# Patient Record
Sex: Female | Born: 1935 | Race: White | Hispanic: No | Marital: Married
Health system: Southern US, Community
[De-identification: ages and names within clinical notes are randomized; demographics above are authoritative.]

---

## 2004-08-30 ENCOUNTER — Ambulatory Visit: Payer: Self-pay | Admitting: Gastroenterology

## 2004-11-06 ENCOUNTER — Ambulatory Visit: Payer: Self-pay

## 2005-11-22 ENCOUNTER — Ambulatory Visit: Payer: Self-pay

## 2006-12-04 ENCOUNTER — Ambulatory Visit: Payer: Self-pay

## 2007-12-15 ENCOUNTER — Ambulatory Visit: Payer: Self-pay | Admitting: Unknown Physician Specialty

## 2007-12-17 ENCOUNTER — Ambulatory Visit: Payer: Self-pay

## 2008-07-21 ENCOUNTER — Ambulatory Visit: Payer: Self-pay | Admitting: Internal Medicine

## 2008-07-25 ENCOUNTER — Ambulatory Visit: Payer: Self-pay | Admitting: Internal Medicine

## 2008-10-27 ENCOUNTER — Inpatient Hospital Stay: Payer: Self-pay | Admitting: Internal Medicine

## 2008-11-07 ENCOUNTER — Emergency Department: Payer: Self-pay | Admitting: Emergency Medicine

## 2008-11-17 ENCOUNTER — Ambulatory Visit: Payer: Self-pay | Admitting: Neurology

## 2009-01-09 ENCOUNTER — Ambulatory Visit: Payer: Self-pay

## 2009-06-09 ENCOUNTER — Emergency Department: Payer: Self-pay | Admitting: Emergency Medicine

## 2011-12-01 ENCOUNTER — Emergency Department: Payer: Self-pay | Admitting: Internal Medicine

## 2011-12-01 LAB — CBC
HCT: 48.6 % — ABNORMAL HIGH (ref 35.0–47.0)
HGB: 15.4 g/dL (ref 12.0–16.0)
MCHC: 31.8 g/dL — ABNORMAL LOW (ref 32.0–36.0)
Platelet: 353 10*3/uL (ref 150–440)
RBC: 5.24 10*6/uL — ABNORMAL HIGH (ref 3.80–5.20)
WBC: 15.6 10*3/uL — ABNORMAL HIGH (ref 3.6–11.0)

## 2011-12-01 LAB — COMPREHENSIVE METABOLIC PANEL
Albumin: 2.8 g/dL — ABNORMAL LOW (ref 3.4–5.0)
Anion Gap: 7 (ref 7–16)
BUN: 21 mg/dL — ABNORMAL HIGH (ref 7–18)
Calcium, Total: 10.7 mg/dL — ABNORMAL HIGH (ref 8.5–10.1)
Creatinine: 1.35 mg/dL — ABNORMAL HIGH (ref 0.60–1.30)
Glucose: 353 mg/dL — ABNORMAL HIGH (ref 65–99)
Osmolality: 276 (ref 275–301)
Potassium: 3.9 mmol/L (ref 3.5–5.1)
Sodium: 129 mmol/L — ABNORMAL LOW (ref 136–145)
Total Protein: 9.1 g/dL — ABNORMAL HIGH (ref 6.4–8.2)

## 2011-12-01 LAB — PROTIME-INR
INR: 0.9
Prothrombin Time: 12.9 secs (ref 11.5–14.7)

## 2011-12-01 LAB — TROPONIN I: Troponin-I: <0.02 ng/mL

## 2011-12-21 ENCOUNTER — Emergency Department: Payer: Self-pay | Admitting: Emergency Medicine

## 2011-12-21 LAB — URINALYSIS, COMPLETE
Blood: NEGATIVE
Glucose,UR: 150 mg/dL (ref 0–75)
Ketone: NEGATIVE
Nitrite: POSITIVE
RBC,UR: NONE SEEN /HPF (ref 0–5)
Specific Gravity: 1.012 (ref 1.003–1.030)
Squamous Epithelial: <1
WBC UR: 1 /HPF (ref 0–5)

## 2011-12-21 LAB — CBC
HCT: 49.8 % — ABNORMAL HIGH (ref 35.0–47.0)
HGB: 16.9 g/dL — ABNORMAL HIGH (ref 12.0–16.0)
MCV: 91 fL (ref 80–100)
Platelet: 200 10*3/uL (ref 150–440)
RBC: 5.48 10*6/uL — ABNORMAL HIGH (ref 3.80–5.20)
RDW: 12.6 % (ref 11.5–14.5)

## 2011-12-21 LAB — COMPREHENSIVE METABOLIC PANEL
Albumin: 3.2 g/dL — ABNORMAL LOW (ref 3.4–5.0)
Anion Gap: 12 (ref 7–16)
BUN: 68 mg/dL — ABNORMAL HIGH (ref 7–18)
Calcium, Total: 9.4 mg/dL (ref 8.5–10.1)
Glucose: 232 mg/dL — ABNORMAL HIGH (ref 65–99)
Potassium: 3.7 mmol/L (ref 3.5–5.1)
SGOT(AST): 54 U/L — ABNORMAL HIGH (ref 15–37)
SGPT (ALT): 33 U/L
Sodium: 136 mmol/L (ref 136–145)
Total Protein: 8.6 g/dL — ABNORMAL HIGH (ref 6.4–8.2)

## 2011-12-21 LAB — TROPONIN I: Troponin-I: <0.02 ng/mL

## 2011-12-23 LAB — URINE CULTURE

## 2011-12-24 ENCOUNTER — Emergency Department: Payer: Self-pay | Admitting: Emergency Medicine

## 2011-12-24 LAB — COMPREHENSIVE METABOLIC PANEL
Albumin: 3.2 g/dL — ABNORMAL LOW (ref 3.4–5.0)
Alkaline Phosphatase: 377 U/L — ABNORMAL HIGH (ref 50–136)
Anion Gap: 12 (ref 7–16)
BUN: 42 mg/dL — ABNORMAL HIGH (ref 7–18)
Bilirubin,Total: 0.3 mg/dL (ref 0.2–1.0)
Calcium, Total: 9.2 mg/dL (ref 8.5–10.1)
Chloride: 103 mmol/L (ref 98–107)
Co2: 22 mmol/L (ref 21–32)
Creatinine: 2.15 mg/dL — ABNORMAL HIGH (ref 0.60–1.30)
EGFR (African American): 25 — ABNORMAL LOW
EGFR (Non-African Amer.): 21 — ABNORMAL LOW
Glucose: 284 mg/dL — ABNORMAL HIGH (ref 65–99)
Osmolality: 295 (ref 275–301)
Potassium: 3.9 mmol/L (ref 3.5–5.1)
SGOT(AST): 40 U/L — ABNORMAL HIGH (ref 15–37)
SGPT (ALT): 34 U/L
Sodium: 137 mmol/L (ref 136–145)
Total Protein: 8.3 g/dL — ABNORMAL HIGH (ref 6.4–8.2)

## 2011-12-24 LAB — URINALYSIS, COMPLETE
Bilirubin,UR: NEGATIVE
Blood: NEGATIVE
Glucose,UR: 150 mg/dL (ref 0–75)
Granular Cast: 1
Hyaline Cast: 3
Leukocyte Esterase: NEGATIVE
Ph: 5 (ref 4.5–8.0)
Protein: NEGATIVE
RBC,UR: NONE SEEN /HPF (ref 0–5)

## 2011-12-24 LAB — TROPONIN I: Troponin-I: <0.02 ng/mL

## 2011-12-24 LAB — CBC WITH DIFFERENTIAL/PLATELET
Basophil #: 0.1 10*3/uL (ref 0.0–0.1)
Basophil %: 0.6 %
Eosinophil #: 0.1 10*3/uL (ref 0.0–0.7)
Eosinophil %: 1.5 %
HGB: 17.1 g/dL — ABNORMAL HIGH (ref 12.0–16.0)
Lymphocyte %: 29.1 %
Monocyte #: 0.6 x10 3/mm (ref 0.2–0.9)
Monocyte %: 6.6 %
Neutrophil #: 5.7 10*3/uL (ref 1.4–6.5)
Neutrophil %: 62.2 %
Platelet: 185 10*3/uL (ref 150–440)
RBC: 5.41 10*6/uL — ABNORMAL HIGH (ref 3.80–5.20)
WBC: 9.2 10*3/uL (ref 3.6–11.0)

## 2012-05-09 LAB — URINALYSIS, COMPLETE
Bilirubin,UR: NEGATIVE
Ketone: NEGATIVE
Nitrite: NEGATIVE
Ph: 7 (ref 4.5–8.0)
Protein: NEGATIVE
RBC,UR: 2 /HPF (ref 0–5)
Specific Gravity: 1.005 (ref 1.003–1.030)

## 2012-05-09 LAB — CBC
HGB: 15 g/dL (ref 12.0–16.0)
MCHC: 34.2 g/dL (ref 32.0–36.0)
MCV: 93 fL (ref 80–100)
RBC: 4.71 10*6/uL (ref 3.80–5.20)
WBC: 9.6 10*3/uL (ref 3.6–11.0)

## 2012-05-09 LAB — COMPREHENSIVE METABOLIC PANEL
Alkaline Phosphatase: 420 U/L — ABNORMAL HIGH (ref 50–136)
Anion Gap: 10 (ref 7–16)
BUN: 20 mg/dL — ABNORMAL HIGH (ref 7–18)
Calcium, Total: 9 mg/dL (ref 8.5–10.1)
Chloride: 107 mmol/L (ref 98–107)
Co2: 23 mmol/L (ref 21–32)
EGFR (African American): >60
EGFR (Non-African Amer.): >60
Potassium: 4 mmol/L (ref 3.5–5.1)
SGOT(AST): 65 U/L — ABNORMAL HIGH (ref 15–37)
SGPT (ALT): 69 U/L (ref 12–78)
Total Protein: 7.9 g/dL (ref 6.4–8.2)

## 2012-05-09 LAB — TROPONIN I: Troponin-I: <0.02 ng/mL

## 2012-05-10 ENCOUNTER — Inpatient Hospital Stay: Payer: Self-pay | Admitting: Internal Medicine

## 2012-05-10 LAB — AMMONIA: Ammonia, Plasma: 34 mcmol/L — ABNORMAL HIGH (ref 11–32)

## 2012-05-11 LAB — LIPID PANEL
Cholesterol: 190 mg/dL (ref 0–200)
Ldl Cholesterol, Calc: 122 mg/dL — ABNORMAL HIGH (ref 0–100)
VLDL Cholesterol, Calc: 41 mg/dL — ABNORMAL HIGH (ref 5–40)

## 2012-05-11 LAB — COMPREHENSIVE METABOLIC PANEL
Albumin: 2.8 g/dL — ABNORMAL LOW (ref 3.4–5.0)
Alkaline Phosphatase: 325 U/L — ABNORMAL HIGH (ref 50–136)
Bilirubin,Total: 0.3 mg/dL (ref 0.2–1.0)
Co2: 26 mmol/L (ref 21–32)
Creatinine: 0.75 mg/dL (ref 0.60–1.30)
EGFR (Non-African Amer.): >60
Glucose: 106 mg/dL — ABNORMAL HIGH (ref 65–99)
Osmolality: 280 (ref 275–301)
SGPT (ALT): 54 U/L (ref 12–78)
Sodium: 140 mmol/L (ref 136–145)
Total Protein: 6.8 g/dL (ref 6.4–8.2)

## 2012-05-11 LAB — CBC WITH DIFFERENTIAL/PLATELET
Basophil #: 0.1 10*3/uL (ref 0.0–0.1)
Basophil %: 1.1 %
Eosinophil %: 3 %
HGB: 14 g/dL (ref 12.0–16.0)
Lymphocyte #: 2.2 10*3/uL (ref 1.0–3.6)
MCV: 93 fL (ref 80–100)
Monocyte %: 8.1 %
Platelet: 221 10*3/uL (ref 150–440)
RDW: 13.9 % (ref 11.5–14.5)
WBC: 7.2 10*3/uL (ref 3.6–11.0)

## 2012-05-11 LAB — HEPATIC FUNCTION PANEL A (ARMC): Bilirubin, Direct: 0.1 mg/dL (ref 0.00–0.20)

## 2012-05-11 LAB — AMMONIA: Ammonia, Plasma: 34 mcmol/L — ABNORMAL HIGH (ref 11–32)

## 2012-05-12 LAB — URINE CULTURE

## 2012-05-27 ENCOUNTER — Ambulatory Visit: Payer: Self-pay | Admitting: Internal Medicine

## 2012-06-06 ENCOUNTER — Inpatient Hospital Stay: Payer: Self-pay | Admitting: Specialist

## 2012-06-06 LAB — CBC WITH DIFFERENTIAL/PLATELET
Basophil #: 0.1 10*3/uL (ref 0.0–0.1)
Eosinophil #: 0.1 10*3/uL (ref 0.0–0.7)
Eosinophil %: 0.9 %
HCT: 41.6 % (ref 35.0–47.0)
HGB: 14.4 g/dL (ref 12.0–16.0)
Lymphocyte #: 1.7 10*3/uL (ref 1.0–3.6)
MCH: 31.4 pg (ref 26.0–34.0)
MCHC: 34.7 g/dL (ref 32.0–36.0)
Monocyte %: 5.4 %
Neutrophil %: 75.2 %
RDW: 12.9 % (ref 11.5–14.5)
WBC: 9.3 10*3/uL (ref 3.6–11.0)

## 2012-06-06 LAB — DRUG SCREEN, URINE
Barbiturates, Ur Screen: NEGATIVE (ref ?–200)
Benzodiazepine, Ur Scrn: NEGATIVE (ref ?–200)
Cocaine Metabolite,Ur ~~LOC~~: NEGATIVE (ref ?–300)
MDMA (Ecstasy)Ur Screen: NEGATIVE (ref ?–500)
Methadone, Ur Screen: NEGATIVE (ref ?–300)
Opiate, Ur Screen: NEGATIVE (ref ?–300)
Phencyclidine (PCP) Ur S: NEGATIVE (ref ?–25)
Tricyclic, Ur Screen: NEGATIVE (ref ?–1000)

## 2012-06-06 LAB — URINALYSIS, COMPLETE
Bilirubin,UR: NEGATIVE
Glucose,UR: NEGATIVE mg/dL (ref 0–75)
Ketone: NEGATIVE
Leukocyte Esterase: NEGATIVE
Protein: NEGATIVE
Specific Gravity: 1.014 (ref 1.003–1.030)
Squamous Epithelial: <1
WBC UR: 1 /HPF (ref 0–5)

## 2012-06-06 LAB — COMPREHENSIVE METABOLIC PANEL
Albumin: 3.2 g/dL — ABNORMAL LOW (ref 3.4–5.0)
Alkaline Phosphatase: 255 U/L — ABNORMAL HIGH (ref 50–136)
Anion Gap: 9 (ref 7–16)
BUN: 56 mg/dL — ABNORMAL HIGH (ref 7–18)
Calcium, Total: 8.5 mg/dL (ref 8.5–10.1)
Chloride: 111 mmol/L — ABNORMAL HIGH (ref 98–107)
Creatinine: 1.4 mg/dL — ABNORMAL HIGH (ref 0.60–1.30)
EGFR (African American): 42 — ABNORMAL LOW
EGFR (Non-African Amer.): 36 — ABNORMAL LOW
Glucose: 106 mg/dL — ABNORMAL HIGH (ref 65–99)
Potassium: 4.7 mmol/L (ref 3.5–5.1)
Sodium: 141 mmol/L (ref 136–145)
Total Protein: 7.3 g/dL (ref 6.4–8.2)

## 2012-06-06 LAB — TROPONIN I: Troponin-I: 0.04 ng/mL

## 2012-06-07 ENCOUNTER — Ambulatory Visit: Payer: Self-pay | Admitting: Neurology

## 2012-06-07 LAB — CBC WITH DIFFERENTIAL/PLATELET
Basophil #: 0.1 10*3/uL (ref 0.0–0.1)
Basophil %: 0.9 %
Eosinophil %: 2.5 %
HCT: 37.6 % (ref 35.0–47.0)
Lymphocyte #: 1.8 10*3/uL (ref 1.0–3.6)
MCHC: 34.6 g/dL (ref 32.0–36.0)
MCV: 91 fL (ref 80–100)
Monocyte %: 9.3 %
Neutrophil %: 63.3 %
RBC: 4.13 10*6/uL (ref 3.80–5.20)
RDW: 12.9 % (ref 11.5–14.5)

## 2012-06-07 LAB — BASIC METABOLIC PANEL
Anion Gap: 8 (ref 7–16)
Chloride: 112 mmol/L — ABNORMAL HIGH (ref 98–107)
EGFR (African American): 54 — ABNORMAL LOW
EGFR (Non-African Amer.): 47 — ABNORMAL LOW
Glucose: 86 mg/dL (ref 65–99)
Osmolality: 290 (ref 275–301)
Sodium: 141 mmol/L (ref 136–145)

## 2012-06-09 LAB — MAGNESIUM: Magnesium: 1.2 mg/dL — ABNORMAL LOW

## 2012-06-27 ENCOUNTER — Ambulatory Visit: Payer: Self-pay | Admitting: Internal Medicine

## 2012-07-06 ENCOUNTER — Ambulatory Visit: Payer: Self-pay | Admitting: Geriatric Medicine

## 2012-07-22 ENCOUNTER — Other Ambulatory Visit: Payer: Self-pay

## 2012-07-22 LAB — URINALYSIS, COMPLETE
Bilirubin,UR: NEGATIVE
Glucose,UR: 50 mg/dL (ref 0–75)
Ketone: NEGATIVE
Ph: 5 (ref 4.5–8.0)
Protein: NEGATIVE
Specific Gravity: 1.023 (ref 1.003–1.030)
WBC UR: 5 /HPF (ref 0–5)

## 2012-07-22 LAB — CBC WITH DIFFERENTIAL/PLATELET
Eosinophil #: 0 10*3/uL (ref 0.0–0.7)
HCT: 42.6 % (ref 35.0–47.0)
Lymphocyte #: 1.8 10*3/uL (ref 1.0–3.6)
Lymphocyte %: 12.7 %
MCHC: 33.8 g/dL (ref 32.0–36.0)
MCV: 93 fL (ref 80–100)
Monocyte %: 8.1 %
Neutrophil %: 78.6 %
Platelet: 370 10*3/uL (ref 150–440)
RBC: 4.61 10*6/uL (ref 3.80–5.20)
RDW: 12.5 % (ref 11.5–14.5)
WBC: 14 10*3/uL — ABNORMAL HIGH (ref 3.6–11.0)

## 2012-07-22 LAB — BASIC METABOLIC PANEL
Anion Gap: 10 (ref 7–16)
BUN: 34 mg/dL — ABNORMAL HIGH (ref 7–18)
Calcium, Total: 9.2 mg/dL (ref 8.5–10.1)
Chloride: 109 mmol/L — ABNORMAL HIGH (ref 98–107)
Co2: 24 mmol/L (ref 21–32)
Creatinine: 1.01 mg/dL (ref 0.60–1.30)
Glucose: 254 mg/dL — ABNORMAL HIGH (ref 65–99)
Sodium: 143 mmol/L (ref 136–145)

## 2012-07-24 LAB — URINE CULTURE

## 2012-10-08 ENCOUNTER — Other Ambulatory Visit: Payer: Self-pay | Admitting: Family Medicine

## 2012-10-08 LAB — BASIC METABOLIC PANEL
Calcium, Total: 9.4 mg/dL (ref 8.5–10.1)
Co2: 29 mmol/L (ref 21–32)
Glucose: 156 mg/dL — ABNORMAL HIGH (ref 65–99)
Osmolality: 280 (ref 275–301)
Potassium: 4 mmol/L (ref 3.5–5.1)

## 2013-05-27 DEATH — deceased

## 2014-08-22 IMAGING — CT CT HEAD WITHOUT CONTRAST
2 series · 15 of 30 positions shown, 19 images · non-contrast
Comparison: none

REASON FOR EXAM: altered mental status
COMMENTS:

[Series 2: without · axial · non-contrast · 0.42mm/px · z∈[-138,-18]mm · 13 of 29 slices shown, 17 images]
[im 3/29  brain]
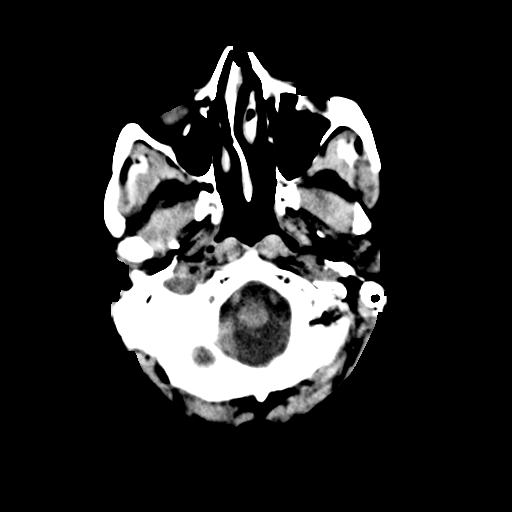
[im 3/29  bone]
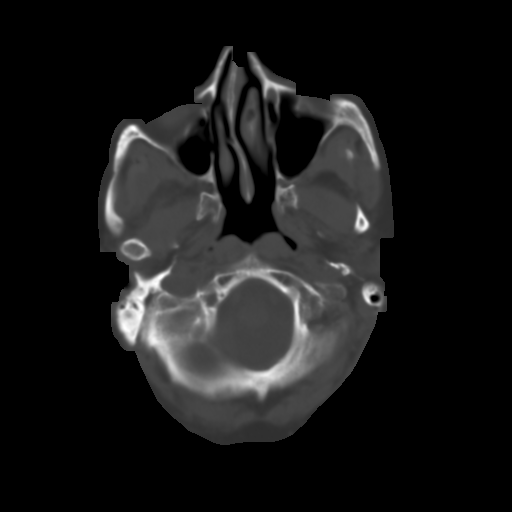
[im 5/29  brain]
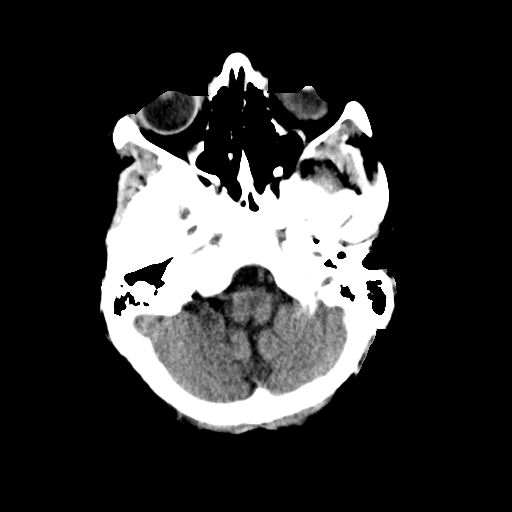
[im 7/29  brain]
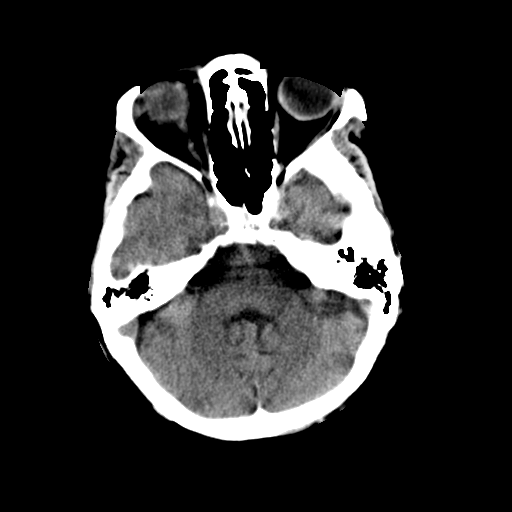
[im 9/29  brain]
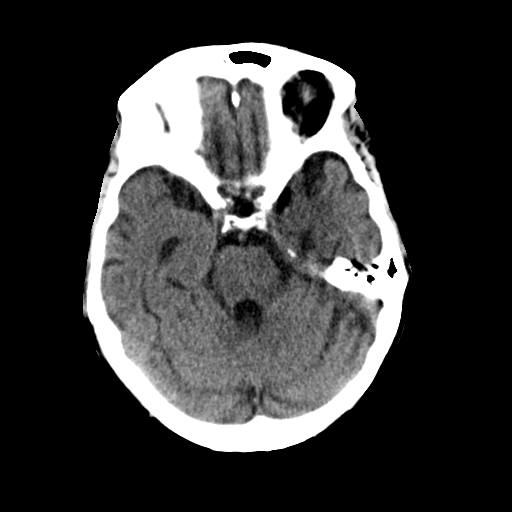
[im 11/29  brain]
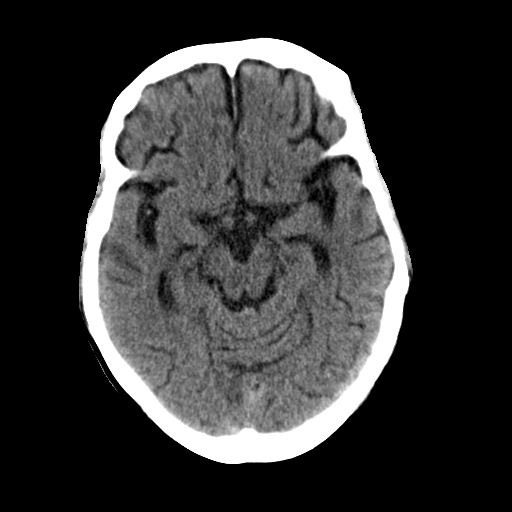
[im 11/29  bone]
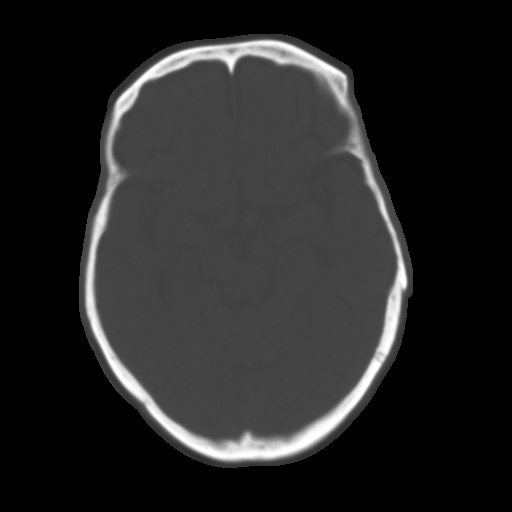
[im 13/29  brain]
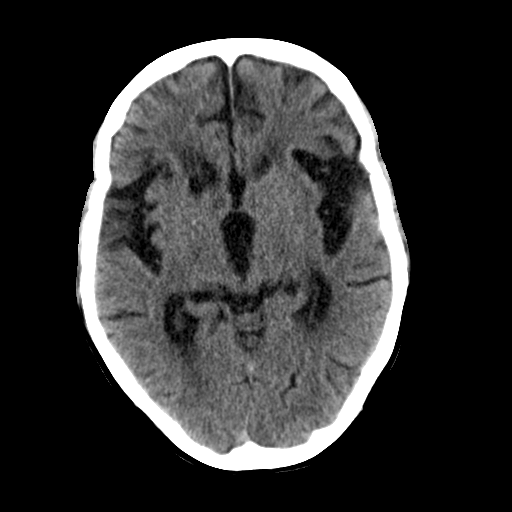
[im 15/29  brain]
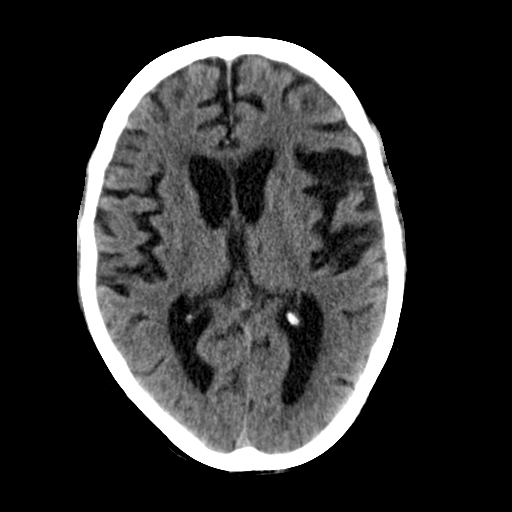
[im 17/29  brain]
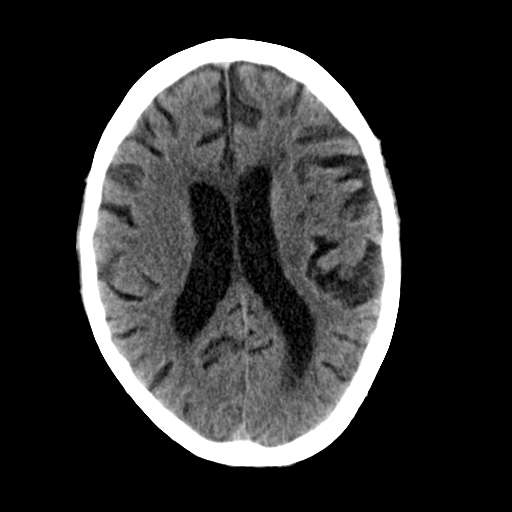
[im 19/29  brain]
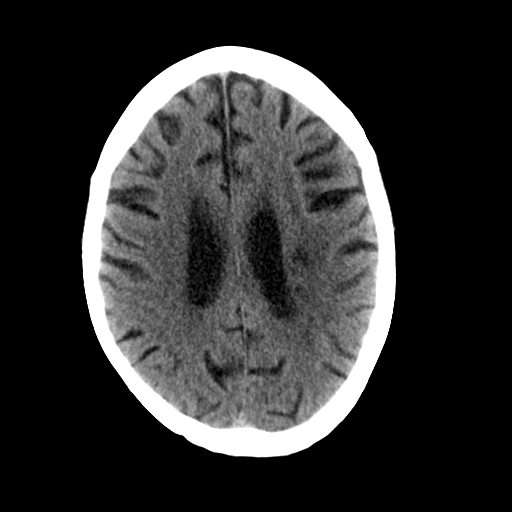
[im 19/29  bone]
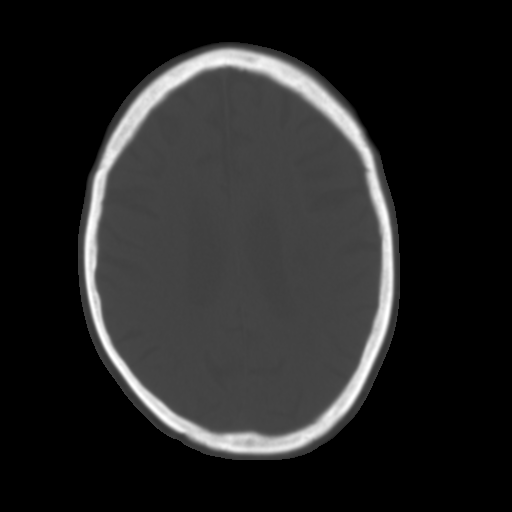
[im 21/29  brain]
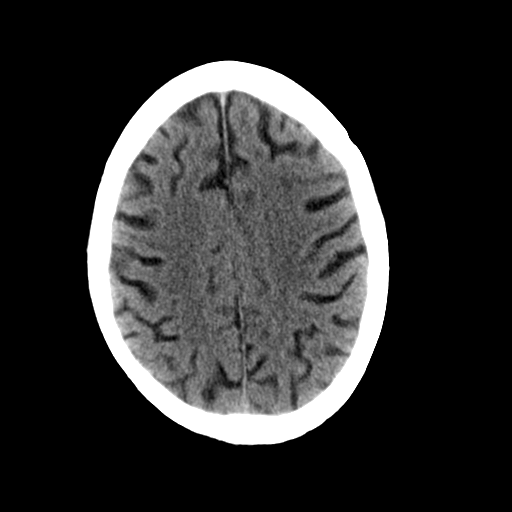
[im 23/29  brain]
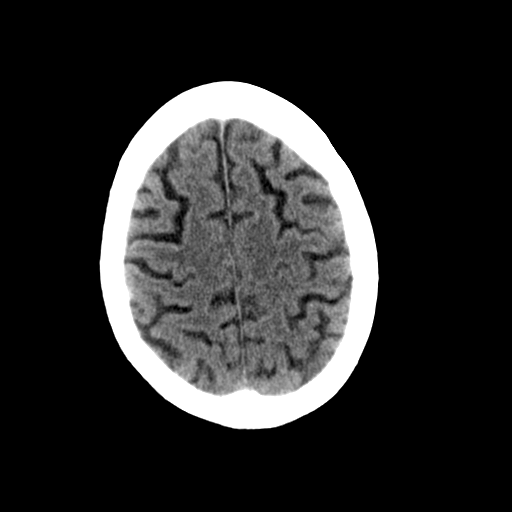
[im 25/29  brain]
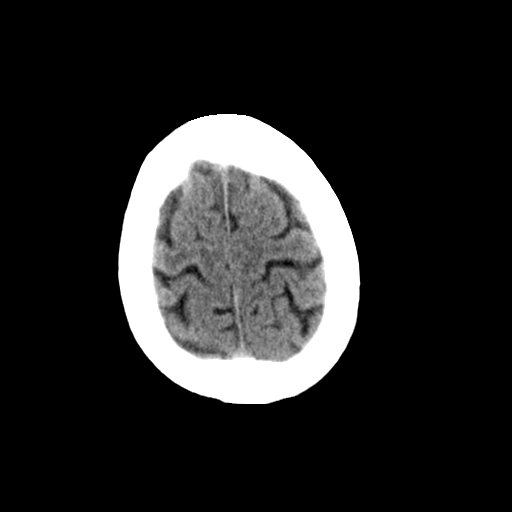
[im 27/29  brain]
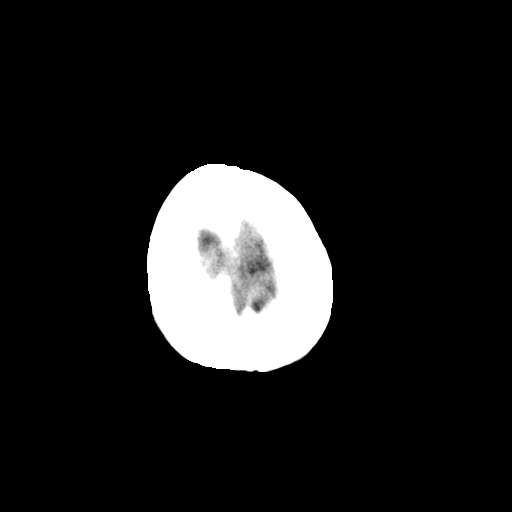
[im 27/29  bone]
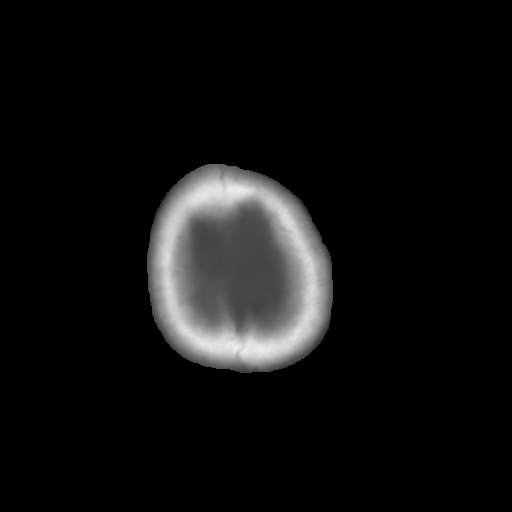

[Series 3: bone · axial · 0.42mm/px · z∈[-138,-118]mm · 2 of 29 slices shown]
[im 3/29  bone]
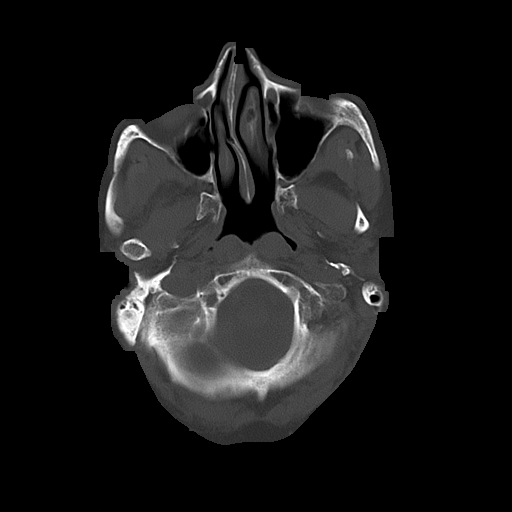
[im 7/29  bone]
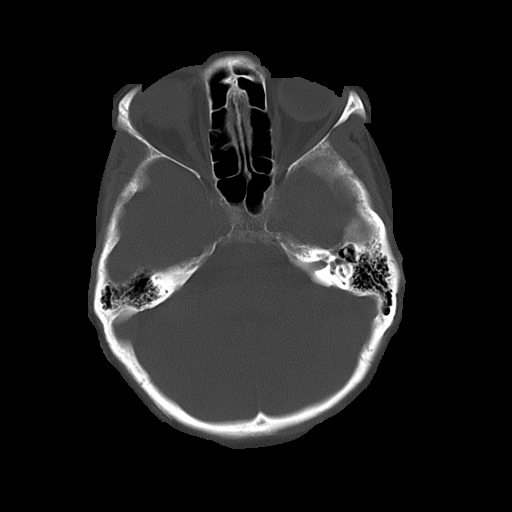

[15 of 30 positions shown; findings below may reference images not displayed]

PROCEDURE:     CT  - CT HEAD WITHOUT CONTRAST  - May 10, 2012  [DATE]

RESULT:     Emergent CT of the brain is compared to the previous study 24 December, 2011.

There is prominence of the ventricles and sulci. There is low-attenuation
diffusely within the periventricular and subcortical white matter. Old
infarcts are seen adjacent to the frontal horn the right lateral ventricle
and adjacent to the body of the left lateral ventricle along with diffuse
low-attenuation in the periventricular and subcortical white matter. There
are bilateral basal ganglia lacunar infarcts. There is no intracranial
hemorrhage, mass or mass effect. The included paranasal sinuses and mastoid
air cells show normal aeration. The calvarium is intact.
IMPRESSION: Changes of atrophy with chronic microvascular ischemic
disease and small lacunar infarct. No acute intracranial abnormality
evident. No significant interval change. MRI followup is available if there
is concern for an acute or evolving infarct which may not be visible by CT
for at least 24 hours.

[REDACTED]

## 2014-08-22 IMAGING — US ABDOMEN ULTRASOUND LIMITED
1 series · 14 of 25 positions shown · non-contrast
Comparison: none

REASON FOR EXAM: hyperammonimia
COMMENTS:   Body Site: GB and Fossa, CBD, Head of Pancreas; Right Upper Quad

[Series 1: abdomen ultrasound limited · 0.25mm/px · 14 of 43 slices shown]
[im 1/43]
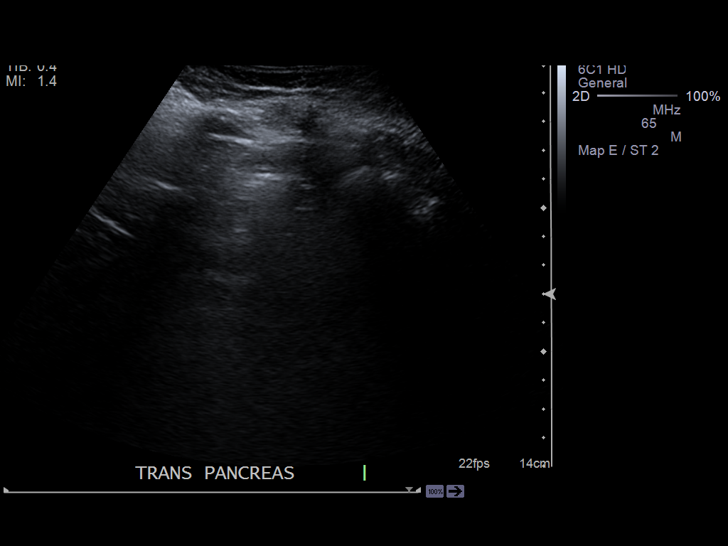
[im 4/43]
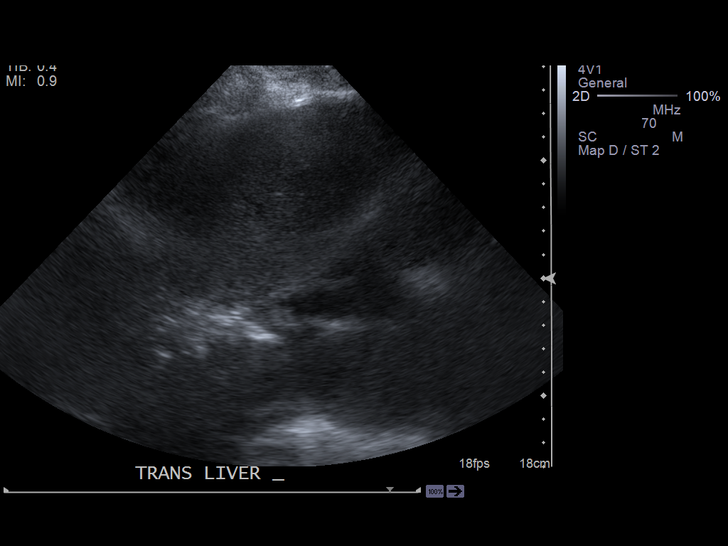
[im 8/43]
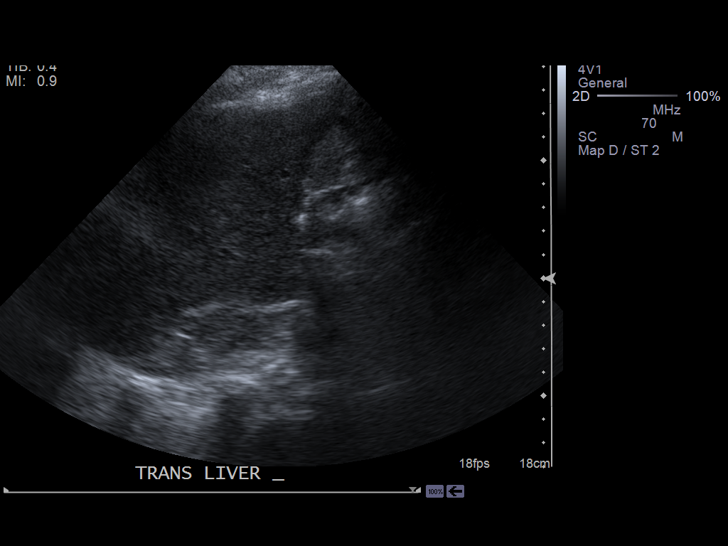
[im 11/43]
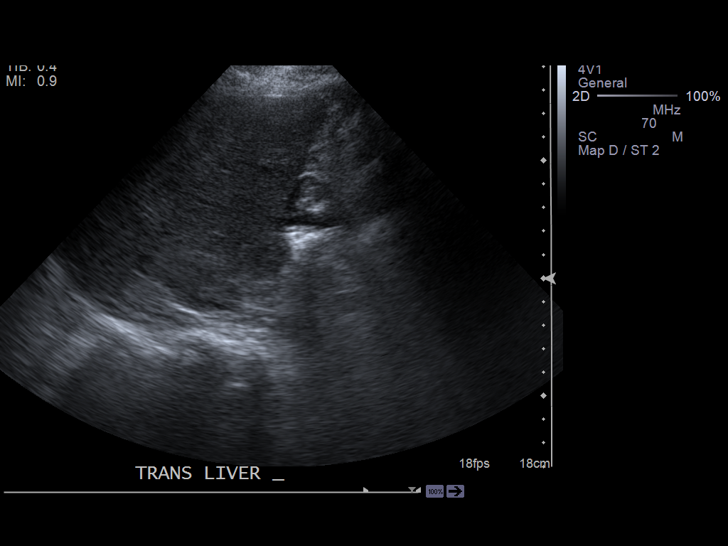
[im 15/43]
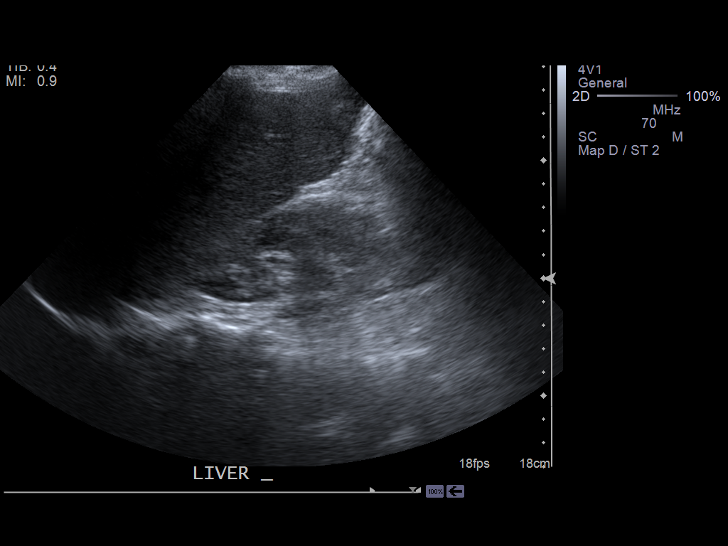
[im 16/43]
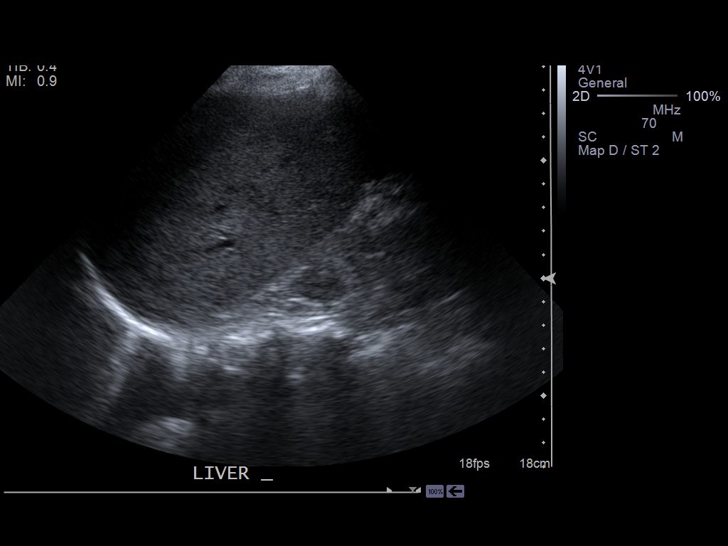
[im 20/43]
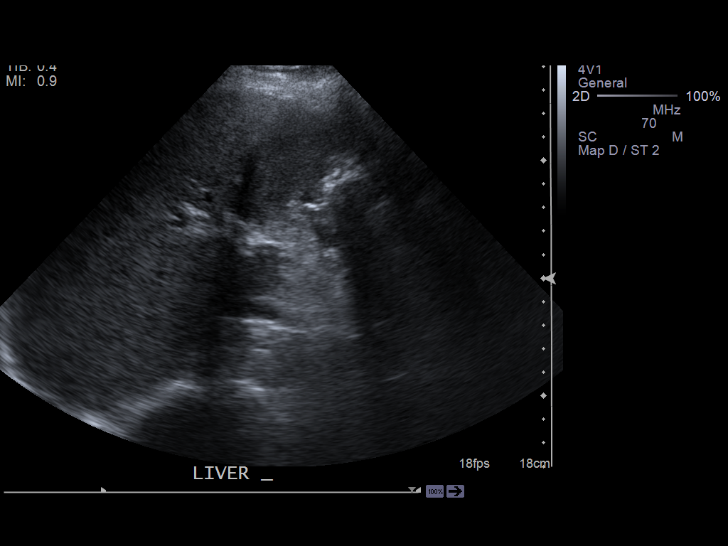
[im 23/43]
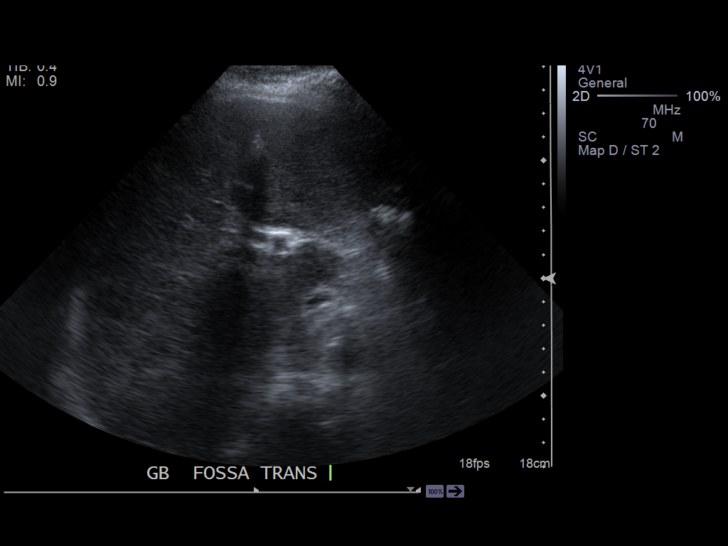
[im 27/43]
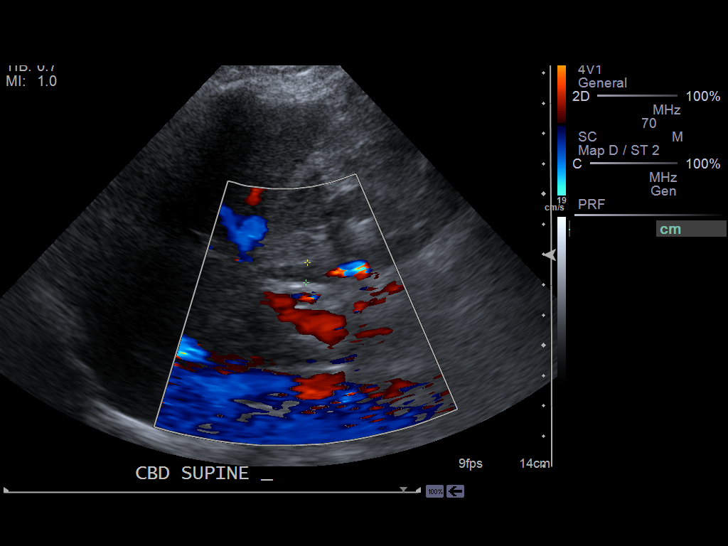
[im 29/43]
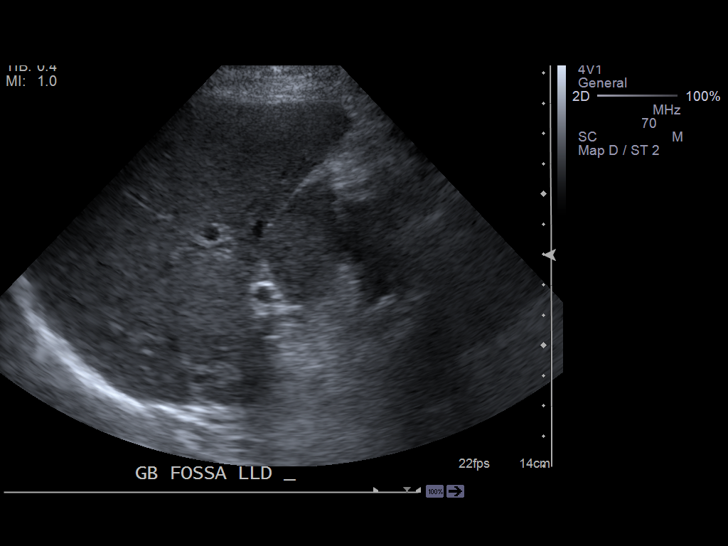
[im 32/43]
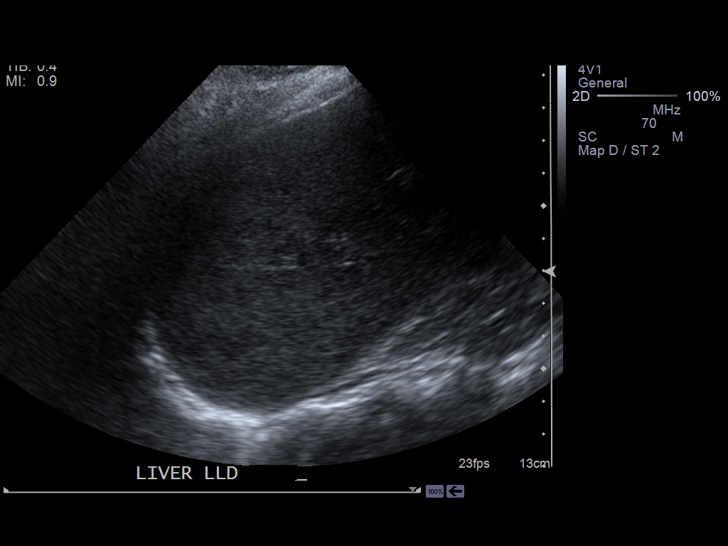
[im 36/43]
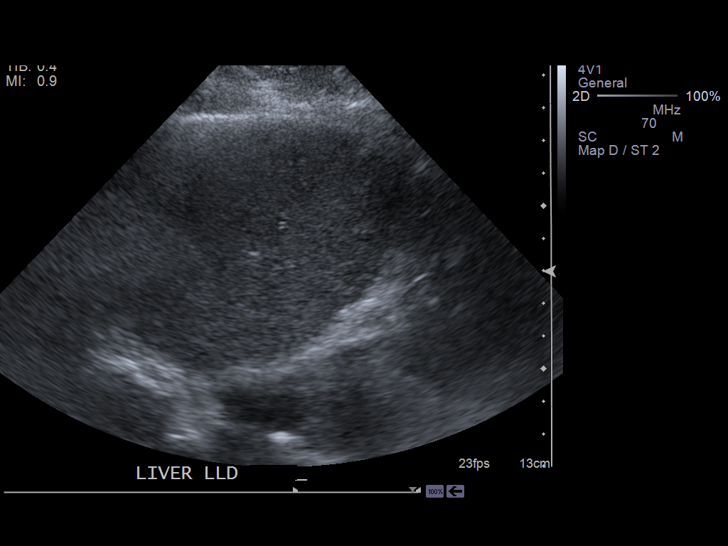
[im 39/43]
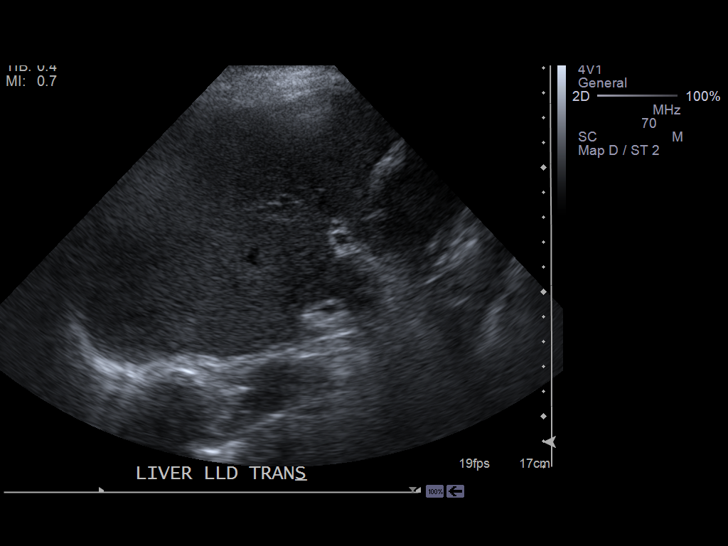
[im 43/43]
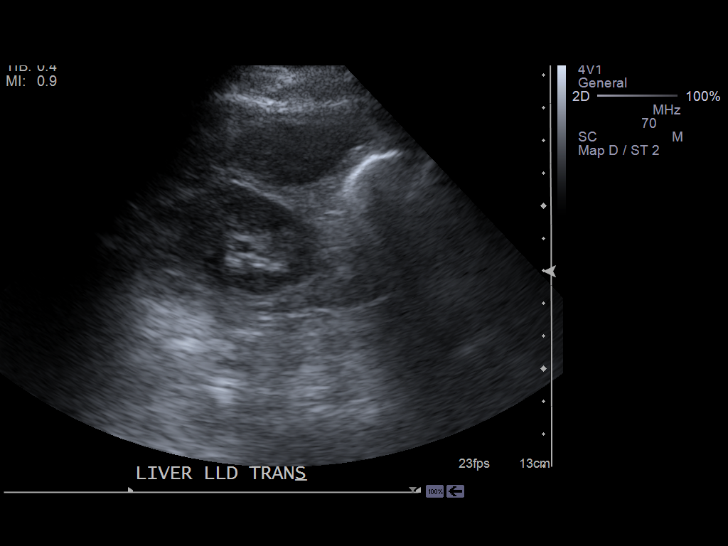

[14 of 25 positions shown; findings below may reference images not displayed]

PROCEDURE:     US  - US ABDOMEN LIMITED SURVEY  - May 10, 2012  [DATE]

RESULT:     A limited right upper quadrant abdominal sonogram is performed.
The patient has no previous exam for comparison. There is a history of
cholecystectomy. The pancreas is not visualized because of overlying bowel
gas. The liver echotexture appears normal. The liver length is 15.86 cm.
Common bile duct diameter is 6.6 mm. No intrahepatic biliary ductal dilation
is present. No ascites is evident.
IMPRESSION: 1. Limited right upper quadrant abdominal sonogram. Patient is status post
cholecystectomy. Common bile duct diameter is 6.6 mm. The liver is
unremarkable. The pancreas could not be seen because of overlying bowel gas.

[REDACTED]

## 2014-08-22 IMAGING — CR DG CHEST 2V
1 series · 2 of 2 positions shown · non-contrast
Comparison: none

REASON FOR EXAM: eval pneumonia
COMMENTS:

[Series 1: x chest ap · 0.14mm/px · 2 of 2 slices shown]
[im 1/2]
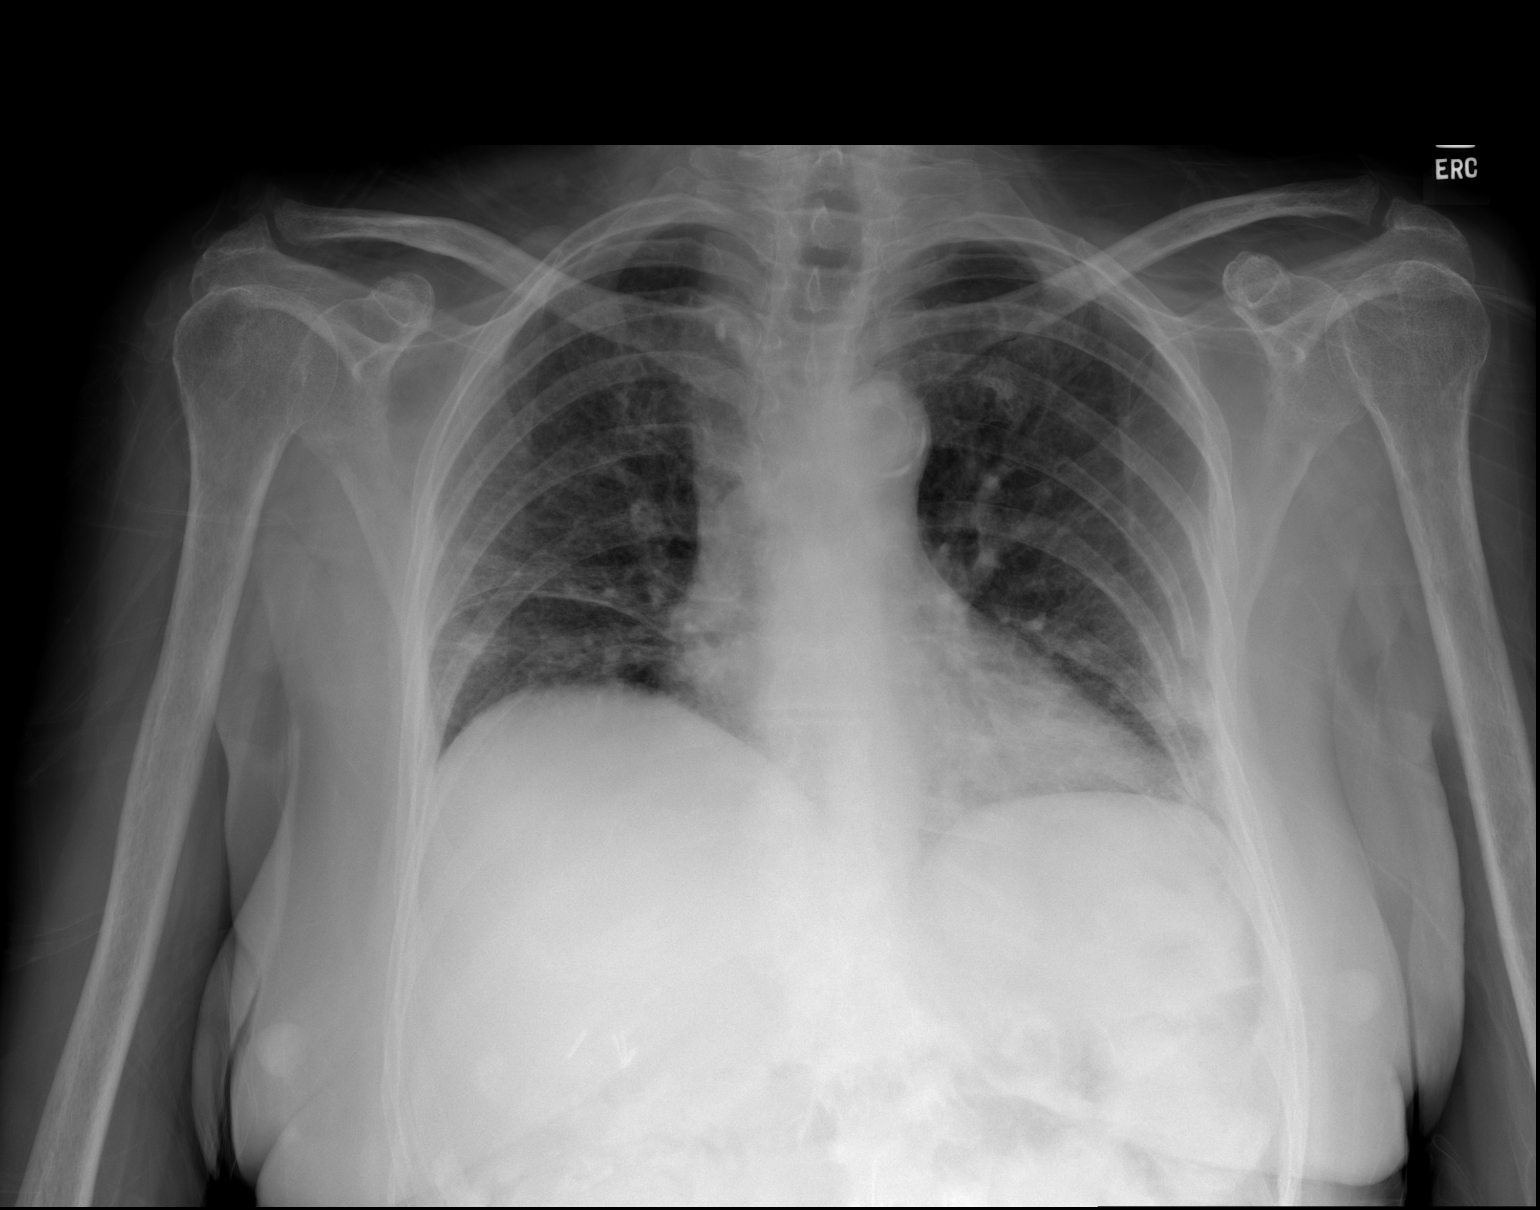
[im 2/2]
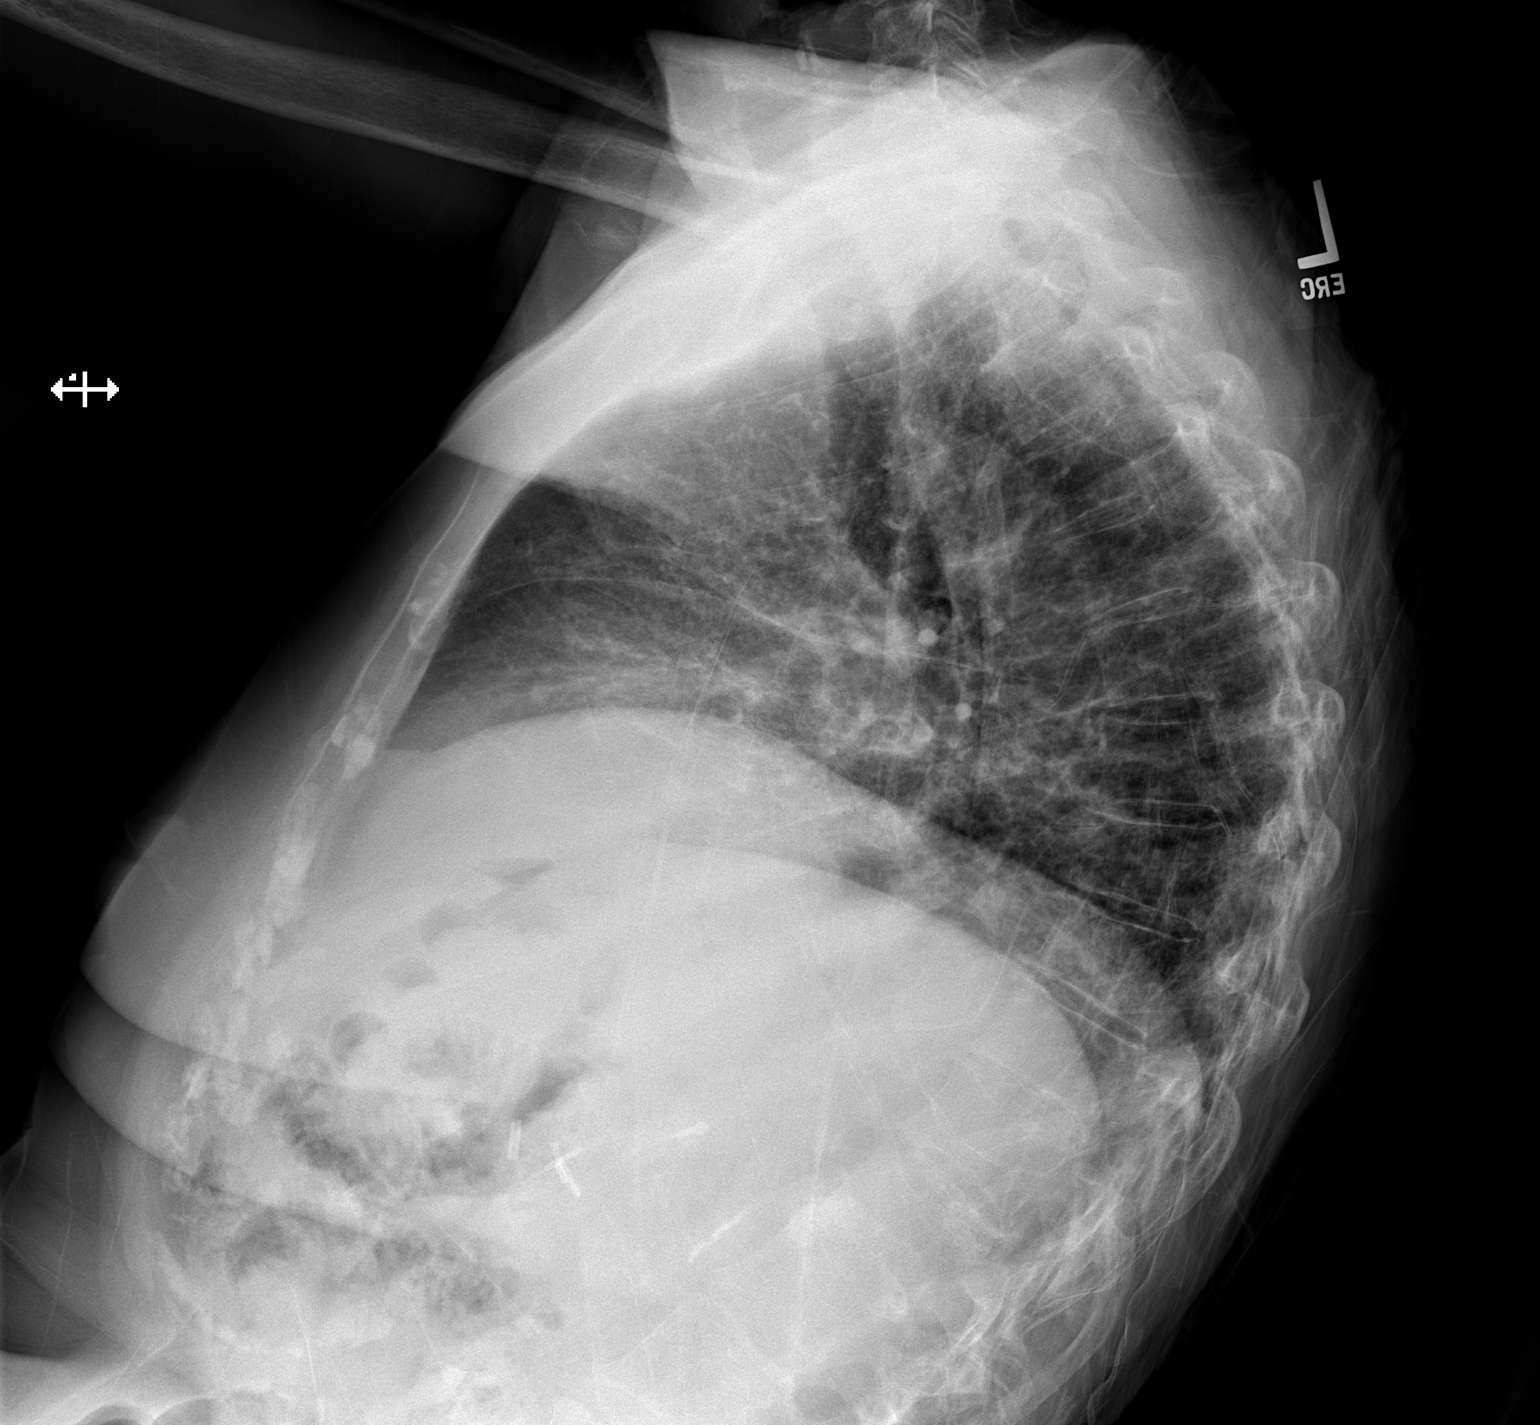

[2 of 2 positions shown; findings below may reference images not displayed]

PROCEDURE:     DXR - DXR CHEST PA (OR AP) AND LATERAL  - May 10, 2012  [DATE]

RESULT:     Comparison is made to the previous examination dated 24 December, 2011. There is hypoinflation. There is increased density at the right lung
base which could represent atelectasis given the poor inspiration. A
developing pneumonia is not excluded. Lung markings are somewhat prominent,
again, likely secondary to hypoinflation. Atelectasis is seen at the left
lung base. There is no effusion or pneumothorax. Atherosclerotic
calcification is noted in the aorta. The cardiac silhouette is normal.
IMPRESSION: Hypoinflation with presumed bibasalar atelectasis.

[REDACTED]

## 2014-09-13 NOTE — Consult Note (Signed)
Referring Physician:  Albertine Patricia   Primary Care Physician:  [facility] Department of Pulmonology, [place], [place] [address], [phone]  Reason for Consult:  Admit Date: 10-May-2012   Chief Complaint: CVA   Reason for Consult: CVA   History of Present Illness:  History of Present Illness:   HISTORY OF PRESENT ILLNESS: to the patient's AMS the history is gathered from family, medical records, and speaking with providers and staff. woman with a history of CVA, HTN, COPD and epilepsy presented yesterday with AMS.  The patient was apparently normal according to her son when she went to bed around 5pm yesterday.  Then she woke up around 830 pm and was abruptly confused with impaired ability to speak that was worse than her baseline level of aphasia.  She was unable to get any meaningful words out.  He called EMS and was taken to [facility] where she was seen to have new facial droop on the right and some mild right sided weakness.  Her HCT was negative for any acute findings but was positive for multiple old lacunar infarcts and diffuse global atrophy.  However, the MRI demonstrates acute infarction as delineated below.  It should be noted that the patient's finger stick glu was found to be low at 60 and the ammonia level was elevated at 34.      Could not be performed due to patient's severe mixed aphasia with inability to understand or express thoughts. MEDICAL HISTORY: 1. Seizures.  Hypertension.  History of cerebrovascular accident.  History of chronic obstructive pulmonary disease.  Asthma.  Hypersensitivity pneumonitis based on lung biopsy in April 1998.  Arthritis with positive rheumatoid factor.  Hypertension.  Hyperlipidemia.  Osteoporosis.  Reflux with an esophageal ring. Cholelithiasis. Osteoporosis.  Coronary artery disease.  Laparoscopic cholecystectomy.  Hysterectomy with removal of one ovary.  HISTORY: She is a never smoker and does not use alcohol or  drugs. HISTORY: Aunt with breast cancer. Cousin with cervical cancer. DM in the family.  as listed below.  No known drug allergies.   GENERAL: woman in NAD.  Normocephalic and atraumatic.  Funduscopic exam shows normal disc size, appearance and C/D ratio.  There is no papilledema. and S2 sounds are within normal limits, without murmurs, gallops, or rubs. - Normal- NormalDrift - Unable to follow this command due to severe mixed aphasia.- Gait and station are not tested due to AMS from severe mixed aphasia.  Strength in the upper and lower extremities is at least 4+/5 throughout, moves all four extremities.  Unable to perform formal  STATUS:is noted to have a severe mixed aphasia.  She is unable to follow simple commands or meaningfully respond.  She occasionally mimics or tries to guess what I am asking her to do.  due to severe mixed aphasia, characterization of her mental status is limited to what I have described above. NERVES:   CN II (generally normal visual acuity and fields, but limited due to inability to communicate due to severe mixed aphasia)   CN III, IV, VI (extraocular muscles are generally intact, but limited due to inability to communicate due to severe mixed aphasia)   CN V (facial sensation is generally intact but limited due to inability to communicate due to severe mixed aphasia)   CN VII (facial strength is generally intact bilaterally)   CN VIII (hearing is generally intact bilaterally)   CN IX/X (unable to test due to inability to communicate due to severe mixed aphasia)  CN XI (Unable to follow command due to severe mixed aphasia)   CN XII (Unable to follow command due to severe mixed aphasia)  Generally intact to pain and temp bilaterally (spinothalamic tracts) Generally intact to position and vibration bilaterally (dorsal columns)    Biceps   Brachioradialis    Patellar   Achilles  Unable to follow command due to severe mixed aphasia, no clear ataxia or incoordination  noted.  81 year old woman with a history of CVA, HTN, COPD and epilepsy presented yesterday with AMS and worsening aphasia. MRI the patient is seen to have acute infarctions in the left posterior parieto-temporal head region which raises concern that this lesion has affected Wernicke's area which is resulting in the severe receptive aphasia noted today.  She is also seen to have severe expressive aphasia.  As such, this combination of findings likely either indicates a global aphasia vs a mixed transcortical aphasia.  It is difficult to distinguish clinically due to her inability to follow commands to repeat my words as a result of not understanding this instruction.  Given her limited exam, it is uncertain if the abnormal basal ganglia findings have resulted in neuorlogic deficits.  I cannot rule out subtle weakness given the limitations on exam described above.  The multiple white matter lesions are of uncertain clinical significance and could represent areas of hypertensive microangiopathy.   the acute stroke, the carotid doppler has been performed and is unremarkable.  Also, the TTE was unremarkable.  She is being monitored for afib on telemetry and this has been negative for afib.  The patient was previously on ASA.  I would agree to changing her to Aggrenox vs Plavix for long term secondary stroke prevention antiplatelet therapy.  She has been adivsed not to smoke.  Agree with checking fasting lipids, continuing Zocor and following blood sugars with treatment with ISS.  Patient has been working with ST for stroke education, speech exercises and swallowing evaluation.  She will need long term speech therapy.  Please allow for permissive HTN up to 220/110 although I have noted that her blood pressure has not been near this level during this hospitalization.   Melrose Nakayama, MD                    Past Medical/Surgical Hx:  Diabetes Mellitus,Type I (IDD):   seizures:   asthma:   acid reflux:   arthritis:    HTN:   Home Medications: Medication Instructions Last Modified Date/Time  hydrochlorothiazide-triamterene 25 mg-37.5 mg oral capsule 1 cap(s) orally once a day 15-Dec-13 12:50  lisinopril 10 mg oral tablet 1 tab(s) orally once a day 15-Dec-13 12:50  atenolol 25 mg oral tablet 1 tab(s) orally once a day 15-Dec-13 12:50   [facility] Current Meds:  Dextrose 5%-NaCl 0.45%, 1000 ml at 75 ml/hr  Sodium Chloride 0.9% injection, 2 ml, IV push, q6h  Acetaminophen * tablet, ( Tylenol (325 mg) tablet)  650 mg Oral q4h PRN for pain or temp. greater than 100.4  - Indication: Pain/Fever  HePARin injection, 5000 unit(s), Subcutaneous, q12h  Indication: Anticoagulant, Monitor Anticoags per hospital protocol  Ondansetron injection, ( Zofran injection )  4 mg, IV push, q4h PRN for Nausea/Vomiting  Indication: Nausea/ Vomiting  cefTRIAXone injection,  ( Rocephin injection )  1 gram, IV Piggyback, q24h, Infuse over 30 minute(s)  Indication: Infection  Influenza Virus Trivalent Vaccine injection, 0.5 ml, Intramuscular, atdischarge  Indication: Provide Active Immunity to Influenza Strains contained in Vaccine, GIVE ON DISCHARGE  DAY.  Pneumococcal 23-valent Vaccine, 0.5 ml, Intramuscular, atdischarge  Indication: Pneumococcal Immunization, 0.77m IM once (Stored in Pyxis Refrigerator)  Aspirin-dipyridamole 25 mg/200 mg,  ( Aggrenox)  1 capsule(s) Oral bid  - Indication: Reduction in stroke risk  Allergies:  NKDA: None  Vital Signs: **Vital Signs.:   16-Dec-13 16:03   Vital Signs Type Routine   Temperature Temperature (F) 98.5   Celsius 36.9   Temperature Source Oral   Pulse Pulse 93   Respirations Respirations 16   Systolic BP Systolic BP 1935  Diastolic BP (mmHg) Diastolic BP (mmHg) 72   Mean BP 91   Pulse Ox % Pulse Ox % 97   Pulse Ox Activity Level  At rest   Oxygen Delivery Room Air/ 21 %   Lab Results:  Hepatic:  14-Dec-13 22:34    Bilirubin, Total 0.3   Alkaline Phosphatase  420    SGPT (ALT) 69   SGOT (AST)  65   Total Protein, Serum 7.9   Albumin, Serum  3.3  16-Dec-13 03:53    Bilirubin, Direct 0.1 (Result(s) reported on 11 May 2012 at 04:45AM.)   Bilirubin, Total 0.3   Alkaline Phosphatase  325   SGPT (ALT) 54   SGOT (AST)  38   Total Protein, Serum 6.8   Albumin, Serum  2.8  Routine Micro:  14-Dec-13 22:00    Organism Name GRAM NEGATIVE ROD   Organism Quantity >100,000 CFU/ML   Micro Text Report URINE CULTURE   ORGANISM 1                >100,000 CFU/ML GRAM NEGATIVE ROD   COMMENT                   ID TO FOLLOW SENSITIVITIES TO FOLLOW   ANTIBIOTIC                        Specimen Source CLEAN CATCH   Organism 1 >100,000 CFU/ML GRAM NEGATIVE ROD   Culture Comment ID TO FOLLOW SENSITIVITIES TO FOLLOW  Result(s) reported on 11 May 2012 at 08:28AM.  Routine Chem:  14-Dec-13 22:34    Glucose, Serum  60   BUN  20   Creatinine (comp) 0.88   Sodium, Serum 140   Potassium, Serum 4.0   Chloride, Serum 107   CO2, Serum 23   Calcium (Total), Serum 9.0   Osmolality (calc) 280   eGFR (African American) >60   eGFR (Non-African American) >60 (eGFR values <631mmin/1.73 m2 may be an indication of chronic kidney disease (CKD). Calculated eGFR is useful in patients with stable renal function. The eGFR calculation will not be reliable in acutely ill patients when serum creatinine is changing rapidly. It is not useful in  patients on dialysis. The eGFR calculation may not be applicable to patients at the low and high extremes of body sizes, pregnant women, and vegetarians.)   Anion Gap 10   Result Comment POTASSIUM/AST - Slight hemolysis, interpret results with  - caution.  Result(s) reported on 09 May 2012 at 11:45PM.  16-Dec-13 03:53    Ammonia, Plasma  34 (Result(s) reported on 11 May 2012 at 04:47AM.)   Cholesterol, Serum 190   Triglycerides, Serum  206   HDL (INHOUSE)  27   VLDL Cholesterol Calculated  41   LDL Cholesterol Calculated  122  (Result(s) reported on 11 May 2012 at 04:35AM.)   Glucose, Serum  106   BUN 12   Creatinine (  comp) 0.75   Sodium, Serum 140   Potassium, Serum 3.5   Chloride, Serum 106   CO2, Serum 26   Calcium (Total), Serum 8.7   Osmolality (calc) 280   eGFR (African American) >60   eGFR (Non-African American) >60 (eGFR values <59m/min/1.73 m2 may be an indication of chronic kidney disease (CKD). Calculated eGFR is useful in patients with stable renal function. The eGFR calculation will not be reliable in acutely ill patients when serum creatinine is changing rapidly. It is not useful in  patients on dialysis. The eGFR calculation may not be applicable to patients at the low and high extremes of body sizes, pregnant women, and vegetarians.)   Anion Gap 8  Cardiac:  14-Dec-13 22:34    Troponin I < 0.02 (0.00-0.05 0.05 ng/mL or less: NEGATIVE  Repeat testing in 3-6 hrs  if clinically indicated. >0.05 ng/mL: POTENTIAL  MYOCARDIAL INJURY. Repeat  testing in 3-6 hrs if  clinically indicated. NOTE: An increase or decrease  of 30% or more on serial  testing suggests a  clinically important change)  Routine UA:  14-Dec-13 22:00    Color (UA) Straw   Clarity (UA) Clear   Glucose (UA) Negative   Bilirubin (UA) Negative   Ketones (UA) Negative   Specific Gravity (UA) 1.005   Blood (UA) Negative   pH (UA) 7.0   Protein (UA) Negative   Nitrite (UA) Negative   Leukocyte Esterase (UA) 1+ (Result(s) reported on 09 May 2012 at 10:53PM.)   RBC (UA) 2 /HPF   WBC (UA) 4 /HPF   Bacteria (UA) 2+   Epithelial Cells (UA) <1 /HPF (Result(s) reported on 09 May 2012 at 10:53PM.)  Routine Hem:  14-Dec-13 22:34    WBC (CBC) 9.6   RBC (CBC) 4.71   Hemoglobin (CBC) 15.0   Hematocrit (CBC) 43.8   Platelet Count (CBC) 230 (Result(s) reported on 09 May 2012 at 10:53PM.)   MCV 93   MCH 31.8   MCHC 34.2   RDW 14.0  16-Dec-13 03:53    WBC (CBC) 7.2   RBC (CBC) 4.44   Hemoglobin (CBC) 14.0    Hematocrit (CBC) 41.1   Platelet Count (CBC) 221   MCV 93   MCH 31.5   MCHC 34.0   RDW 13.9   Neutrophil % 57.1   Lymphocyte % 30.7   Monocyte % 8.1   Eosinophil % 3.0   Basophil % 1.1   Neutrophil # 4.1   Lymphocyte # 2.2   Monocyte # 0.6   Eosinophil # 0.2   Basophil # 0.1 (Result(s) reported on 11 May 2012 at 04:35AM.)   Radiology Results: [place]    15-Dec-13 09:48, [place]ilateral   [place] Bilateral    REASON FOR EXAM:    APHASIA,CONFUSION  COMMENTS:       PROCEDURE: [place] - [place] DOPPLER BILATERAL  - May 10 2012  9:48AM     RESULT: Carotid Doppler interrogation is performed in the standard   fashion. The patient has previously undergone a similar study on 27 October 2008.    Today's exam demonstrates the presence of atherosclerotic plaque with   calcification bilaterally and more diffuse in the left carotid system.   Visually there does not appear to be significant stenosis. Vertebral   arteries show antegrade flow bilaterally without flow reversal. The color   Doppler and spectral Doppler appearance is normal. The peak systolic   velocities are normal bilaterally.  The internal to common carotid peak     systolic velocity ratio is 0.72 onthe right and 0.40 on the left.    IMPRESSION:  Atherosclerotic disease without evidence of hemodynamically   significant stenosis. Antegrade flow in the vertebrals without flow   reversal.    Dictation Site: 6        Verified By: Alla German., MD  MRI:    16-Dec-13 10:37, MRI Brain Without Contrast   MRI Brain Without Contrast    REASON FOR EXAM:    CVA  COMMENTS:       PROCEDURE: MR  - MR BRAIN WO CONTRAST  - May 11 2012 10:37AM     RESULT: Multiplanar images were obtained through the brain without   administration of gadolinium.    The diffusion weighted images reveal smallamounts of increased white   matter signal distributed in the posterior parietal lobe on the left.   This  increased signal reaches the cortical surface while there is also   increased signal in the deep white matter. There are small amounts of   increased signal associated with the basal ganglia bilaterally also   consistent with acute ischemia. The cerebellum and brainstem appear   spared.   The inversion recovery-FLAIR sequences reveal increased white matter   signal in the same areas consistent with chronic small vessel ischemic   type change. There is moderate diffuse cerebral and cerebellar atrophy   with compensatory ventriculomegaly. There are no findings suspicious for   acute intracranial hemorrhage. No abnormal intranor extra-axial fluid   collections are demonstrated. The paranasal sinuses and mastoid air cells   exhibit no abnormal fluid. Cranial nerves VII and VIII are grossly normal   at the cerebellopontine angles.    IMPRESSION:   1. There are findings consistent with acute ischemic change in the left   posterior parietal gray and white matter as well as within the basal   ganglia bilaterally. This is superimposed upon findings of increased   white matter signal in the periventricular regions which has become more   conspicuous since an MRI of the brain dated October 28, 2008.  2. There is moderate diffuse cerebral and cerebellar atrophy which has   progressed since the previous study.  3. I do not see evidence of intracranial blood collections.     Dictation Site: 1        Verified By: DAVID A. Martinique, M.D., MD  CT:    15-Dec-13 02:09, CT Head Without Contrast   CT Head Without Contrast    REASON FOR EXAM:    altered mental status  COMMENTS:       PROCEDURE: CT  - CT HEAD WITHOUT CONTRAST  - May 10 2012  2:09AM     RESULT: Emergent CT of the brain is compared to the previous study of 24 December 2011.    There is prominence of the ventricles and sulci. There is low-attenuation   diffusely within the periventricular and subcortical white matter. Old   infarcts are seen  adjacent to the frontal horn the right lateral   ventricle and adjacent to the body of the left lateral ventricle along   with diffuse low-attenuation in the periventricular and subcortical white   matter. There are bilateral basal ganglia lacunar infarcts. There is no   intracranial hemorrhage, mass or mass effect. The included paranasal     sinuses and mastoid aircells show normal aeration. The calvarium is   intact.  IMPRESSION:  Changes of atrophy with chronic microvascular ischemic   disease and small lacunar infarct. No acute intracranial abnormality   evident. No significant interval change. MRI followup is available if   there is concern for an acute or evolving infarct which may not be   visible by CT for at least 24 hours.    Dictation Site: 6        Verified By: Sundra Aland, M.D., MD   Electronic Signatures: Anabel Bene (MD)  (Signed 17-Dec-13 10:32)  Authored: REFERRING PHYSICIAN, Primary Care Physician, Consult, History of Present Illness, Review of Systems, PAST MEDICAL/SURGICAL HISTORY, HOME MEDICATIONS, Current Medications, ALLERGIES, NURSING VITAL SIGNS, LAB RESULTS, RADIOLOGY RESULTS   Last Updated: 17-Dec-13 10:32 by Anabel Bene (MD)

## 2014-09-13 NOTE — H&P (Signed)
PATIENT NAME:  Catherine Henderson, Catherine Henderson MR#:  [id number] DATE OF BIRTH:  11/03/1931  DATE OF ADMISSION:  05/10/2012  REFERRING PHYSICIAN: Lucrezia Europe, MD  PRIMARY CARE PHYSICIAN: Follows at [facility] in [place].   CHIEF COMPLAINT: Confusion, altered mental status and aphasia.   HISTORY OF PRESENT ILLNESS: This is a 81 year old female with significant past medical history of CVA in the past, hypertension, seizures, and COPD who presents with altered mental status. The son reports his mother went to bed around 5:00 p.m. where she woke up around 8:30 p.m. where he noticed she is confused, not at her mental baseline, where she is usually communicative and more alert, with some mild slurred speech but able to form full sentences. He reports she had aphasia and not able to verbalize any words. She did not recognize him. She appeared to be confused which prompted him to call EMS. In the ED, the patient was noticed to have some mild right side facial droop and right side weakness where the son reports this is old since her previous CVA. The patient was not able to verbalize any words, so the patient had CT of the brain done which did not show any acute finding, but did show significant multiple small hypodensities in the basal ganglia consistent with remote lacunar infarctions, as well diffuse global atrophy. The son reports his mother has been at the nursing home with full assistance secondary to her right side weakness. As well the patient was found to have decreased fingerstick blood glucose with glucose of 60, which improved after she was given some orange juice, to 91. As well, her ammonia level was found to be elevated at 34 and she was found to have +1 leukocytes esterase and 4 white blood cells on urinalysis. The patient is confused, an extremely poor historian and unable to give any history of present illness or review of systems. Most of the history was obtained by ED staff and previous documentation and the  patient's son over the phone.   PAST MEDICAL HISTORY: 1. Seizures.  2. Hypertension.  3. History of cerebrovascular accident.  4. History of chronic obstructive pulmonary disease.  5. Asthma.  6. Hypersensitivity pneumonitis based on lung biopsy in April 1998.  7. Arthritis with positive rheumatoid factor.  8. Hypertension.  9. Hyperlipidemia.  10. Osteoporosis.  11. Reflux with an esophageal ring. 12. Cholelithiasis. 13. Osteoporosis.  14. Coronary artery disease.  15. Laparoscopic cholecystectomy.  16. Hysterectomy with removal of one ovary.   HOME MEDICATIONS: At this point, the patient's medication is unknown, son reports he will bring this afternoon.   ALLERGIES: No known drug allergies.   SOCIAL HISTORY: She does not smoke, has never smoked, and does not drink.   FAMILY HISTORY: Celine Ahr has breast cancer. Cousin has cervical cancer. History of diabetes in the family. This was obtained from previous documentation.   REVIEW OF SYSTEMS: The patient is confused with significant aphasia, unable to provide review of systems.   PHYSICAL EXAMINATION:  VITAL SIGNS: Temperature is 97.9, pulse 82, respiratory rate 18, blood pressure 159/87, and saturating 98% on room air.   GENERAL: Frail, elderly female who looks comfortable and in no apparent distress.   HEENT: Head atraumatic, normocephalic. Pupils equal and reactive to light. Pink conjunctivae. Anicteric sclerae. Moist oral mucosa.   NECK: Supple. No thyromegaly. No JVD.   CHEST: Good air entry bilaterally. No wheezing or rhonchi.   CARDIOVASCULAR: S1 and S2 heard. No rubs, murmurs or gallops.  ABDOMEN: Soft, nontender and nondistended. Bowel sounds present.   EXTREMITIES: No edema. No clubbing. No cyanosis. Pulses felt bilaterally.  NEUROLOGIC: Unable to evaluate appropriately secondary to the patient's altered mental status, but appeared to be having mild right facial droop, barely noticed. As well some right side  weakness, 4 out of 5 motor strength.   PSYCHIATRIC: Unable to evaluate secondary to aphasia and confusion.   SKIN: Normal skin turgor. Warm and dry.   PERTINENT LABS: Glucose 60, BUN 20, creatinine 0.88, sodium 140, potassium 4, chloride 107, CO2 23, total protein 7.9, total bilirubin 0.3, alkaline phosphatase 420, AST 65, and ALT 69. Troponin less than 0.02. White blood cells 9.6, hemoglobin 15, hematocrit 43.8 and platelets 230. Urinalysis is showing leukocyte esterase +1 and white blood cells +4.   RADIOLOGIC DATA: CT of brain without contrast is showing no acute intracranial abnormality and showing multiple small hypodensities in the basal ganglia consistent with remote lacunar infarction and mild small vessel ischemic changes in the periventricular white matter. No evidence of an acute cortical infarct.   ASSESSMENT AND PLAN: This is a 81 year old female who presents with altered mental status, confusion and aphasia and has known history of CVA in the past with residual mild right side weakness and facial droop, found to have mild urinary tract infection, with elevated alkaline phosphatase and mildly elevated AST as well as elevated ammonia and low blood sugar.  1. Encephalopathy. This is most likely related to CVA. The patient will be given aspirin, will be started on statin, will check MRI of the brain, will check carotid Doppler and 2-D echocardiogram, will consult neurology service and will have PT and speech swallow service consulted as well. This appears to be the main factor for her altered mental status, confusion and aphasia, but as well there might be some other contributing factor. We will have to monitor them to see if they might be contributing to her encephalopathy, mainly her elevated ammonia level, even though borderline. We will recheck level in the a.m. We will treat her UTI as well and she had some hypoglycemia upon admission with fingersticks of 60. We will continue on D5W.   2. Elevated LFTs. We will check right upper quadrant ultrasound.  3. History of seizures. We will resume the patient on her home medications when brought by her son.  4. History of CVA.  We will continue on statin. If the patient has confirmed CVA on MRI, we will change her aspirin to Plavix.  5. Hypertension. We will hold her home medication. We will target systolic blood pressure to be between 160 to 180 in the first 48 hours and try to avoid peri-CVA hypotension.  6. COPD. Appears to be stable. No wheezing. We will continue on nebulizers as needed. In her history she appears to have been on Adair in the past, it will be resumed if it is still on her home medication once brought by the son.  7. Diabetes mellitus. The patient's blood sugar was on the lower side. We will hold insulin until the patient is cleared by swallow service and can be resumed on p.o. diet. Meanwhile, we will continue with fingersticks every six hours. We will have her on D5 half-normal saline. 8. UTI. Continue with Rocephin.  9. CODE STATUS IS FULL CODE.  10. The patient's medication list is currently unknown, discussed with the son who will bring it today, so will have the patient's medications reviewed once brought by the son.   TOTAL  TIME SPENT ON ADMISSION AND PATIENT CARE: 55 minutes.  ____________________________ Starleen Armsawood S. Marly Schuld, MD dse:sb D: 05/10/2012 09:02:14 ET T: 05/10/2012 13:56:49 ET JOB#: [id number]  cc: Starleen Armsawood S. Maddox Bratcher, MD, <Dictator> Moe Brier Teena IraniS Analaura Messler MD ELECTRONICALLY SIGNED 05/11/2012 4:25

## 2014-09-16 NOTE — H&P (Signed)
PATIENT NAME:  Catherine Henderson, SEIDENBERG MR#:  [id number] DATE OF BIRTH:  1931-09-29  DATE OF ADMISSION:  06/06/2012  CHIEF COMPLAINT:  Altered mental status.   HISTORY OF PRESENT ILLNESS:  The patient is a 81 year old white female with cerebrovascular disease and prior strokes who was admitted December 15th to [facility] with a left parietal stroke and right-sided weakness.  She was discharged home on December 17th on Aggrenox and aspirin as well as Bactrim for 3 additional days for treatment of a UTI.  At time of discharge, according to nephew, patient was having right-sided weakness and was able to answer yes or no questions.  On the day of admission today, nephew noticed that she was no longer able to answer yes and no questions, but was awake and alert.  She was sent to the ER and in the Emergency Room she underwent a head CT which was suggestive of an acute parietal occipital infarct, again on the left side, with no mass effect or hemorrhage and chronic small vessel disease.  Old lacunar infarcts were also noted.  Concurrently, patient was noted to have critical thrombocytopenia with a platelet count 25,000.  The patient's platelet count on December 16th was 221,000.   Her primary care doctor is Guy Sandifer for COPD.   PAST MEDICAL HISTORY: 1.  Cerebrovascular disease status post multiple prior CVAs including a lacunar basal ganglia infarct and a left parietal infarct in December.   2.  Seizure disorder.  3.  COPD.  4.  Hypertension.  5.  Diabetes.  6.  Hypersensitivity pneumonitis based on lung biopsy in April 1998.  7.  Osteoporosis.  8.  Esophageal reflux with history of esophageal stricture.  9.  Cholelithiasis.  10.  Coronary artery disease.   PAST SURGICAL HISTORY:  Laparoscopic cholecystectomy and a hysterectomy with unilateral oophorectomy.   ALLERGIES:  She has no known drug allergies.   HOME MEDICATIONS:  Include Maxzide 37.5/25 one daily, lisinopril 10 mg daily, metformin XR 500 mg daily,  Zocor 40 mg daily, Aggrenox/aspirin 1 tablet daily and Bactrim DS 1 tablet twice daily for 3 days ending December 17th.   FAMILY HISTORY:  Notable for an aunt with breast cancer, a cousin with cervical cancer and diabetes in both parents.   SOCIAL HISTORY:  She does not smoke and has no history of alcohol abuse.  She is currently living in rehab from her prior stroke.   REVIEW OF SYSTEMS:  Unobtainable as nephew is not present and the patient is now aphasic.   PHYSICAL EXAMINATION: GENERAL:  This is a well-nourished elderly female who appears comfortable.  She is moving all extremities in bed.  VITAL SIGNS:  Initial blood pressure at time of ER admission was 83/62, however repeat blood pressure was 117/63, pulse initially ranging from 101 to 88 and regular.  Temperature is 98.3, respirations 16, saturating 98% on room air.  HEENT:  Pupils are equal, round and reactive to light.  Extraocular movements are intact.  Sclerae are anicteric.  OROPHARYNX:  Normal with normal dentition.  NECK:  Supple without lymphadenopathy, JVD, thyromegaly or carotid bruits.  LUNGS:  Clear to auscultation anteriorly with no wheezing or rhonchi.  CARDIOVASCULAR:  Regular rhythm, was slightly tachycardic.  No chest wall tenderness.  Pedal pulses are palpable and there is no lower extremity edema.  ABDOMEN:  Soft, appears to be nontender with good bowel sounds and no evidence of hepatosplenomegaly.  MUSCULOSKELETAL:  She could not cooperate with exam, but she is moving  all extremities.  SKIN:  Warm and dry without rashes or lesions.  There is no cervical, axillary, inguinal or supraclavicular lymphadenopathy.  NEUROLOGICAL:  She is aphasic.  She does not appear to be choking and seems to be managing her airway fine.  Deep tendon reflexes are elicited with regard to patellar and brachialis radius.  Sensation difficult to assess.  There is no Babinski reflex and there is no clonus.  She again is moving all extremities, has  no contractures, but cannot follow commands.  She is alert and awake, but aphasic and does not appear to understand commands.   ADMISSION LABORATORY DATA:  Sodium 141, potassium 4.7, chloride 111, bicarb 21, BUN 56, creatinine 1.4, albumin low at 3.2, alkaline phosphatase elevated at 255, AST slightly elevated at 43, troponin I 0.04.  Urine drug screen is negative.  White count 9.3, hemoglobin 14.4, hematocrit 41.6, platelets 25,000, MCV 91.  Urinalysis is normal.  CT of the head shows an acute parietal/occipital infarct in the left side with no mass effect or hemorrhage.  Chronic small vessel ischemic changes noted.  Old lacunar infarct on the right in the caudate nucleate area.  Mild atrophy with increased ventriculomegaly compensatory.   ASSESSMENT AND PLAN: 1.  Altered mental status secondary to recurrent stroke on the left parietal occipital side.  Etiology unclear.  Given that she has had a recurrent stroke on the same side and has been observed in the past to be in normal sinus rhythm, must suspect that she has either a mass or hyperplasia of an artery.  MRI ordered with contrast.  She is not a candidate for thrombolytics or heparin given her extremely low platelet count.  2.  Thrombocytopenia. Unclear whether this is drug-induced versus consumptive from stroke.  She was recently placed on Bactrim and has been on Aggrenox since discharge.  I would think if this were due to Bactrim, she would have recovered by now since she has been off of Bactrim for at least 3 weeks.  We will stop the Aggrenox.  Neurology and hematology consults ordered.  Again, she is not a candidate for antiplatelet therapy.  Consider whether she is a candidate for platelet transfusion.  Since she is not actively bleeding, she is not really a candidate for that either.  3.  History of diabetes mellitus.  We are holding metformin for now given need for contrasted studies.  4.  Hypertension.  Holding her lisinopril and her Maxzide  given her soft blood pressures on admission.  IV fluids started.  5.  Disposition.  The patient was FULL CODE when she left the hospital at last admission.  Nephew is currently unavailable.  I have ordered a palliative care consult in the event that she does not recover the ability to respond coherently and to facilitate goals of care with nephew who is surviving relative.   ESTIMATED TIME OF CARE:  Sixty minutes.     ____________________________ Duncan Dulleresa Tito Ausmus, MD tt:ea D: 06/06/2012 18:10:05 ET T: 06/07/2012 05:01:56 ET JOB#: [id number]  cc: Duncan Dulleresa Derinda Bartus, MD, <Dictator> Duncan DullERESA Marsha Hillman MD ELECTRONICALLY SIGNED 07/06/2012 19:40

## 2014-09-16 NOTE — Consult Note (Signed)
PATIENT NAME:  Catherine Henderson, Catherine Henderson MR#:  [id number] DATE OF BIRTH:  [birth year]  DATE OF CONSULTATION:  06/07/2012  CONSULTING PHYSICIAN:  Pauletta Browns, MD  HISTORY OF PRESENT ILLNESS: All the information is obtained from the patient's chart and a nephew who is at bedside. This is a pleasant 81 year old female with past medical history of stroke in December 2013, presenting with right-sided weakness. The stroke was found to be a left parieto-occipital region . She was discharged to her assisted living facility at that time reason.  The reason she comes back to the hospital, as per nephew, the patient's mental status has changed. At baseline, she needs full assistance with feeding, dressing, bathroom privileges, as well as cleaning herself; but she was able to communicate in yes and no commands, and that has changed in the past week. For stroke prevention, the patient has been on Aggrenox. On admission, a CAT scan of the head was done; there was suspicion of acute infarct in the left parieto-occipital area, which is the same area that she had a stroke on prior admission in December.  At the same time, on her laboratory work-up her platelet count was low, and it was 25,000. No signs of any ecchymosis or bleeding was found.   PAST MEDICAL HISTORY: Significant COPD, stroke that was discussed earlier, questionable history of seizures that were a long time ago. The patient has history of hypertension, diabetes, osteoporosis, esophageal reflux disease and coronary artery disease.   PAST SURGICAL HISTORY: Laparoscopy and cholecystectomy.   HOME MEDICATIONS:  Reviewed.   FAMILY HISTORY: Aunt had history of breast cancer and cousin had history of cervical cancer.  SOCIAL HISTORY: The patient does not smoke, does not drink. She lives in an assisted living and rehab facility. The only family member that she has is her nephew that visits her.   PHYSICAL EXAMINATION:  VITAL SIGNS: Temperature 97.7, respirations 20,  pulse 93, blood pressure is 122/74, pulse oximetry is 97% on room air.    NEUROLOGICAL EXAMINATION:  General: The patient is awake, has her eyes open, not able to follow simple commands.  Cranial nerve examination: Pupils are 4 mm to 2 mm symmetrically. She appears to track me throughout the room. Visual threats are intact bilateral. Facial sensation is hard to assess. There is evidence of right facial droop noted. Tongue appears to be midline.  Motor strength: Right upper and right lower extremity actually appeared to be symmetrical, 4 out of 5 bilaterally. Right lower extremity is weaker than left lower extremity. It would be a 3+ out of 5.  Sensation: Could not be assessed.  Reflexes: Diminished throughout, 1+.   ASSESSMENT: This is a 81 year old female with multiple medical problems, a recent history of stroke, discharged from the hospital a month ago with a left parieto-occipital infarct, comes in with altered mental status, suspected new infarct based on the CAT scan. On reviewing the data, she has a low platelet count 25,000. The patient was started on hydration because of suspected dehydration. As per family member, her mental status has significantly improved, and she actually tried to verbalize a couple of words to me which she has not done in the past couple of days.   IMPRESSION: I am not so convinced this is acute infarct in the left parieto-occipital area because it is in the same area as the old stroke, and it could be possible still mild edema around that area. She has no new focal deficits that would imply significant stroke.  Her altered mental status, I do not suspect it is caused by the stroke since the parieto-occipital region should not be contributing to mental status change.   PLAN: At this point, I would continue hydration. I  agree with Hematology consult for evaluation of decreased platelet count. Stop any antiplatelet. If the patient was on heparin for DVT prophylaxis, please  stop that and send a HIT panel.  For neurological work-up, I would still like to obtain an MRI of the brain, carotid Dopplers and an echocardiogram just to finish up the stroke work-up and to make sure that the left parieto-occipital infarct is actually, indeed, is a new infarct. Please call with any questions. Thank you for seeing the privilege of seeing this patient.  ____________________________ Pauletta BrownsYuriy Geraldy Akridge, MD yz:cb D: 06/07/2012 12:22:28 ET T: 06/07/2012 19:16:48 ET JOB#: [id number]  cc: Pauletta BrownsYuriy Ginny Loomer, MD, <Dictator> Pauletta BrownsYURIY Mariangela Heldt MD ELECTRONICALLY SIGNED 06/21/2012 13:42

## 2014-09-16 NOTE — Discharge Summary (Signed)
PATIENT NAME:  Catherine Henderson, Catherine Henderson MR#:  [id number] DATE OF BIRTH:  08-14-1931  DATE OF ADMISSION:  06/06/2012 DATE OF DISCHARGE:  06/10/2012  For a detailed note, please take a look at the history and physical done on admission by Dr. Darrick Huntsman.   DIAGNOSES AT DISCHARGE: 1.  Acute left parietal and occipital cerebrovascular accident.  2.  Aphasia secondary to the acute cerebrovascular accident. 3.  Hypertension. 4.  Diabetes. 5.  Hyperlipidemia. 6.  Chronic obstructive pulmonary disease.   DIET: The patient is being discharged on mechanical soft, low sodium, low fat diet.   ACTIVITY: As tolerated.   FOLLOWUP:  With the primary care physician at the skilled nursing facility.   DISCHARGE MEDICATIONS:  Lisinopril 10 mg daily, metformin 1000 mg daily, simvastatin 40 mg at bedtime, Lantus 10 units at bedtime, magnesium oxide 400 mg b.i.d., aspirin 81 mg daily, Plavix 75 mg daily.   CODE STATUS:  DO NOT INTUBATE, DO NOT RESUSCITATE.   CONSULTANTS DURING THE HOSPITAL COURSE:   Dr.Yuriy Zeylikman from neurology.   PERTINENT STUDIES DONE DURING HOSPITAL COURSE:  A CT scan of the head done without contrast on admission showing findings consistent with acute ischemic infarction involving the mid and posterior parietal and occipital lobes on the left. No intracranial mass effect, no acute intracranial hemorrhage.  MRI of the brain also showing findings consistent with a large acute infarction involving the majority of the left occipital and parietal lobes, multiple small infarcts in the subcortical white matter of the left frontal lobes.   HOSPITAL COURSE: This is a 81 year old female with medical problems as mentioned above, who presented to the hospital on 06/06/2012 due to altered mental status and aphasia.   PROBLEM #1:  Altered mental status/aphasia. The likely cause of this was probably an acute CVA. The patient was recently discharged from the hospital about 3 weeks prior to being readmitted to the  hospital with altered mental status and aphasia. On her previous hospitalization she was noted to have a left-sided parietal CVA and discharged on Aggrenox. She now presented with worsening aphasia and mental status change. Her CT of the head done in the ER showed an acute CVA involving the left side of the parietal and occipital lobes. Initially she was noted to have a low platelet count, which was lab error, therefore, no antiplatelet agent was started. Although once her platelet count was reported to be normal, she was started on aspirin. Due to her large CVA, the patient continues to be severely aphasic. She has been seen by speech and now is tolerating a mechanical soft diet without any problems. She would likely need further speech and aggressive physical therapy given her large CVA. For now, she is alert, but remains aphasic and nonverbal. She will be discharged on a baby aspirin, Plavix and a statin and ongoing therapy as stated at the skilled nursing facility.   PROBLEM #2:  Acute left-sided parietal and occipital CVA. As mentioned, the patient was on Aggrenox prior to coming into the hospital, is now being switched over to aspirin and Plavix along with a statin as stated. She will continue speech and physical and occupational therapy at the skilled nursing facility.   PROBLEM #3:  Hypertension. Given her acute stroke, we allowed some permissive hypertension. She has remained fairly stable from that standpoint now.  For now, I am discontinuing her hydrochlorothiazide but she will continue her low dose lisinopril as stated.   PROBLEM #4:  Hyperlipidemia. The patient was maintained  on her simvastatin. She will resume that.   PROBLEM #5:  COPD. She had no evidence of any acute COPD exacerbation. She currently is on no medications regarding that.   PROBLEM #6: Hypomagnesemia.  She currently is being discharged on p.o. magnesium supplements.   PROBLEM #7:  Diabetes: The patient was maintained on  sliding scale insulin initially as she was not taking anything p.o., but now that she is taking p.o. well, she will be discharged back on her metformin and Lantus as stated.   CODE STATUS: The patient is a DO NOT INTUBATE, DO NOT RESUSCITATE.   Time spent at discharge: 40 minutes.     ____________________________ Arlette Schaad J. Cherlynn KaiserSainani, MD vjs:ct D: 06/10/2012 10:47Rolly Pancake:56 ET T: 06/10/2012 11:27:27 ET JOB#: [id number]  cc: Rolly PancakeVivek J. Cherlynn KaiserSainani, MD, <Dictator> Houston SirenVIVEK J Aleshia Cartelli MD ELECTRONICALLY SIGNED 06/12/2012 21:03

## 2014-09-16 NOTE — Discharge Summary (Signed)
PATIENT NAME:  Catherine Henderson, Batul M MR#:  [id number] DATE OF BIRTH:  21-May-1932  DATE OF ADMISSION:  05/10/2012 DATE OF DISCHARGE:    PRIMARY CARE PHYSICIAN: Herbon E. Meredeth IdeFleming, MD   DISCHARGE DIAGNOSIS: Stroke.   HISTORY OF PRESENT ILLNESS: The patient is a 81 year old female with significant past history of CVA in the past, hypertension, seizures, COPD, presented with altered mental status. Son reported that mother went to bedroom around 5:00 p.m. When she woke up around 8:30, he noted that she was confused, not at her mental baseline where she is usually communicating and more alert, some mild slurred speech but able to form full sentences. He reported that she had aphasia, not able to verbalize any words. She did not recognize him also. She appeared to be confused, which prompted him to call EMS. In the ER, she was also noticed as having some right-sided facial droop and right-sided weakness, where the son reported that this is old since her previous CVA. The patient was not able to verbalize any words, so the patient had CT of the brain done, which did not show any finding, but showed some significant multiple small diffuse global atrophy and so she was admitted with the finding of cerebrovascular accident.   HOSPITAL COURSE AND STAY: The usual workups for the stroke were done for her and the results are as following: MRI of the brain without contrast impression: Findings consistent with acute ischemic change in left posterior parietal gray and white matter, as well as within the basal ganglia bilaterally. This is superimposed upon finding of increased white matter signal in periventricular regions, which has been more conspicuous since MRI of the brain in June 2010. There is moderate diffuse cerebral and cerebellar atrophy, which has progressed since the previous study. No evidence of intracranial blood collections.    Carotid Doppler study: Atherosclerotic disease without evidence of hemodynamically  significant stenosis, antegrade flow in vertebral without flow reversal.   Echocardiogram: Normal left ventricular function. Mild to moderate MR, tricuspid regurgitation and aortic insufficiency. Normal wall thickness. Systolic function is normal.  For the stroke, she was already taking aspirin before coming to the hospital and so started on Aggrenox and statin. Physical therapy evaluation was done and they suggested to go for rehab placement. That was arranged while she was here and she is being discharged to rehab.   Other medical issues addressed during the hospital stay: Elevated LFTs: Right upper quadrant sonogram was done, which was negative, and LFTs were trending down so we did not do any further workup.   COPD: She was stable without any wheezing in the hospital.  Diabetes mellitus: She was initially n.p.o. and then on soft diet. Blood sugar was running between 150 to 200 and so we started on metformin.  Urinary tract infection: She was on Rocephin while in hospital. She received 4 days of Rocephin IV while in the hospital. The urine culture grows E. coli which are resistant to ampicillin, ciprofloxacin and levofloxacin. They are sensitive to Bactrim and so we are discharging on Bactrim for 3 more days to be taken.   CONSULTATIONS DURING HOSPITAL STAY: Neurology consult was done.   CONDITION ON DISCHARGE: Stable.   CODE STATUS: Full code.   DISCHARGE MEDICATIONS: Hydrochlorothiazide/triamterene 25 mg/37.5 mg capsule 1 capsule once a day, lisinopril 10 mg once a day, metformin 500 mg extended-release once a day, simvastatin 40 mg oral tablet at bedtime, aspirin, __________ /200 mg oral capsule extended-release 2 times a day, Bactrim 400/80  mg 2 tabs orally 2 times a day for 3 days.   HOME OXYGEN: None.   DIET: Low fat, low cholesterol, carbohydrate-controlled ADA diet, mechanical soft diet.   ACTIVITY LIMITATION: As tolerated.   FOLLOWUP: Within 1 to 2 weeks with Dr. Ned Clines, [facility], Department of Pulmonology, [place].  TOTAL TIME SPENT IN DISCHARGE: 45 minutes.    ____________________________ Hope Pigeon Elisabeth Pigeon, MD [id number] D: 05/13/2012 15:38:27 ET T: 05/13/2012 16:22:30 ET JOB#: [id number]  cc: Hope Pigeon. Elisabeth Pigeon, MD, <Dictator> Herbon E. Meredeth Ide, MD [facility] MD ELECTRONICALLY SIGNED 06/08/2012 23:22
